# Patient Record
Sex: Male | Born: 1960
Health system: Southern US, Community
[De-identification: ages and names within clinical notes are randomized; demographics above are authoritative.]

## PROBLEM LIST (undated history)

## (undated) DIAGNOSIS — E119 Type 2 diabetes mellitus without complications: Secondary | ICD-10-CM

## (undated) DIAGNOSIS — F419 Anxiety disorder, unspecified: Secondary | ICD-10-CM

## (undated) DIAGNOSIS — M1990 Unspecified osteoarthritis, unspecified site: Secondary | ICD-10-CM

## (undated) DIAGNOSIS — R3914 Feeling of incomplete bladder emptying: Secondary | ICD-10-CM

## (undated) DIAGNOSIS — E7800 Pure hypercholesterolemia, unspecified: Secondary | ICD-10-CM

## (undated) DIAGNOSIS — K219 Gastro-esophageal reflux disease without esophagitis: Secondary | ICD-10-CM

## (undated) HISTORY — PX: OTHER SURGICAL HISTORY: SHX169

---

## 1998-04-07 ENCOUNTER — Encounter: Admission: RE | Admit: 1998-04-07 | Discharge: 1998-07-06 | Payer: Self-pay | Admitting: Anesthesiology

## 1998-06-22 ENCOUNTER — Ambulatory Visit (HOSPITAL_COMMUNITY): Admission: RE | Admit: 1998-06-22 | Discharge: 1998-06-22 | Payer: Self-pay | Admitting: Neurological Surgery

## 1998-07-10 ENCOUNTER — Ambulatory Visit (HOSPITAL_BASED_OUTPATIENT_CLINIC_OR_DEPARTMENT_OTHER): Admission: RE | Admit: 1998-07-10 | Discharge: 1998-07-10 | Payer: Self-pay | Admitting: *Deleted

## 1998-08-10 ENCOUNTER — Encounter: Payer: Self-pay | Admitting: Neurological Surgery

## 1998-08-14 ENCOUNTER — Encounter: Payer: Self-pay | Admitting: Neurological Surgery

## 1998-08-14 ENCOUNTER — Inpatient Hospital Stay (HOSPITAL_COMMUNITY): Admission: RE | Admit: 1998-08-14 | Discharge: 1998-08-19 | Payer: Self-pay | Admitting: Neurological Surgery

## 1999-08-08 ENCOUNTER — Encounter: Payer: Self-pay | Admitting: Neurological Surgery

## 1999-08-08 ENCOUNTER — Encounter: Admission: RE | Admit: 1999-08-08 | Discharge: 1999-08-08 | Payer: Self-pay | Admitting: Neurological Surgery

## 2000-01-09 ENCOUNTER — Encounter: Payer: Self-pay | Admitting: Neurological Surgery

## 2000-01-09 ENCOUNTER — Ambulatory Visit (HOSPITAL_COMMUNITY): Admission: RE | Admit: 2000-01-09 | Discharge: 2000-01-09 | Payer: Self-pay | Admitting: Neurological Surgery

## 2000-02-29 ENCOUNTER — Ambulatory Visit (HOSPITAL_BASED_OUTPATIENT_CLINIC_OR_DEPARTMENT_OTHER): Admission: RE | Admit: 2000-02-29 | Discharge: 2000-02-29 | Payer: Self-pay | Admitting: Orthopedic Surgery

## 2000-10-21 HISTORY — PX: LUMBAR FUSION: SHX111

## 2001-02-05 ENCOUNTER — Encounter: Payer: Self-pay | Admitting: Neurological Surgery

## 2001-02-09 ENCOUNTER — Inpatient Hospital Stay (HOSPITAL_COMMUNITY): Admission: RE | Admit: 2001-02-09 | Discharge: 2001-02-12 | Payer: Self-pay | Admitting: Neurological Surgery

## 2001-02-09 ENCOUNTER — Encounter: Payer: Self-pay | Admitting: Neurological Surgery

## 2001-04-08 ENCOUNTER — Encounter: Admission: RE | Admit: 2001-04-08 | Discharge: 2001-04-08 | Payer: Self-pay | Admitting: Neurological Surgery

## 2001-04-08 ENCOUNTER — Encounter: Payer: Self-pay | Admitting: Neurological Surgery

## 2001-07-22 ENCOUNTER — Encounter: Payer: Self-pay | Admitting: Neurological Surgery

## 2001-07-22 ENCOUNTER — Encounter: Admission: RE | Admit: 2001-07-22 | Discharge: 2001-07-22 | Payer: Self-pay | Admitting: Neurological Surgery

## 2006-02-19 ENCOUNTER — Encounter: Payer: Self-pay | Admitting: Neurological Surgery

## 2014-12-12 DIAGNOSIS — Z6841 Body Mass Index (BMI) 40.0 and over, adult: Secondary | ICD-10-CM | POA: Diagnosis not present

## 2014-12-12 DIAGNOSIS — Z0000 Encounter for general adult medical examination without abnormal findings: Secondary | ICD-10-CM | POA: Diagnosis not present

## 2014-12-12 DIAGNOSIS — I10 Essential (primary) hypertension: Secondary | ICD-10-CM | POA: Diagnosis not present

## 2014-12-12 DIAGNOSIS — E119 Type 2 diabetes mellitus without complications: Secondary | ICD-10-CM | POA: Diagnosis not present

## 2015-02-20 DIAGNOSIS — E119 Type 2 diabetes mellitus without complications: Secondary | ICD-10-CM | POA: Diagnosis not present

## 2015-02-20 DIAGNOSIS — Z6841 Body Mass Index (BMI) 40.0 and over, adult: Secondary | ICD-10-CM | POA: Diagnosis not present

## 2015-07-27 DIAGNOSIS — Z713 Dietary counseling and surveillance: Secondary | ICD-10-CM | POA: Diagnosis not present

## 2015-07-27 DIAGNOSIS — Z6841 Body Mass Index (BMI) 40.0 and over, adult: Secondary | ICD-10-CM | POA: Diagnosis not present

## 2015-07-27 DIAGNOSIS — E782 Mixed hyperlipidemia: Secondary | ICD-10-CM | POA: Diagnosis not present

## 2015-07-27 DIAGNOSIS — E119 Type 2 diabetes mellitus without complications: Secondary | ICD-10-CM | POA: Diagnosis not present

## 2015-08-14 DIAGNOSIS — E782 Mixed hyperlipidemia: Secondary | ICD-10-CM | POA: Diagnosis not present

## 2015-08-14 DIAGNOSIS — E119 Type 2 diabetes mellitus without complications: Secondary | ICD-10-CM | POA: Diagnosis not present

## 2015-08-16 DIAGNOSIS — Z6841 Body Mass Index (BMI) 40.0 and over, adult: Secondary | ICD-10-CM | POA: Diagnosis not present

## 2015-08-16 DIAGNOSIS — E119 Type 2 diabetes mellitus without complications: Secondary | ICD-10-CM | POA: Diagnosis not present

## 2015-08-16 DIAGNOSIS — E785 Hyperlipidemia, unspecified: Secondary | ICD-10-CM | POA: Diagnosis not present

## 2015-09-21 DIAGNOSIS — J00 Acute nasopharyngitis [common cold]: Secondary | ICD-10-CM | POA: Diagnosis not present

## 2015-09-26 DIAGNOSIS — R05 Cough: Secondary | ICD-10-CM | POA: Diagnosis not present

## 2015-09-26 DIAGNOSIS — H6691 Otitis media, unspecified, right ear: Secondary | ICD-10-CM | POA: Diagnosis not present

## 2015-09-26 DIAGNOSIS — Z6841 Body Mass Index (BMI) 40.0 and over, adult: Secondary | ICD-10-CM | POA: Diagnosis not present

## 2015-12-05 DIAGNOSIS — Z6841 Body Mass Index (BMI) 40.0 and over, adult: Secondary | ICD-10-CM | POA: Diagnosis not present

## 2015-12-05 DIAGNOSIS — Z125 Encounter for screening for malignant neoplasm of prostate: Secondary | ICD-10-CM | POA: Diagnosis not present

## 2015-12-05 DIAGNOSIS — Z0000 Encounter for general adult medical examination without abnormal findings: Secondary | ICD-10-CM | POA: Diagnosis not present

## 2015-12-05 DIAGNOSIS — Z1211 Encounter for screening for malignant neoplasm of colon: Secondary | ICD-10-CM | POA: Diagnosis not present

## 2016-02-15 DIAGNOSIS — M792 Neuralgia and neuritis, unspecified: Secondary | ICD-10-CM | POA: Diagnosis not present

## 2016-02-15 DIAGNOSIS — Z6841 Body Mass Index (BMI) 40.0 and over, adult: Secondary | ICD-10-CM | POA: Diagnosis not present

## 2016-02-15 DIAGNOSIS — M542 Cervicalgia: Secondary | ICD-10-CM | POA: Diagnosis not present

## 2016-02-20 DIAGNOSIS — M5412 Radiculopathy, cervical region: Secondary | ICD-10-CM | POA: Diagnosis not present

## 2016-02-29 DIAGNOSIS — M50222 Other cervical disc displacement at C5-C6 level: Secondary | ICD-10-CM | POA: Diagnosis not present

## 2016-02-29 DIAGNOSIS — M4802 Spinal stenosis, cervical region: Secondary | ICD-10-CM | POA: Diagnosis not present

## 2016-02-29 DIAGNOSIS — Q7649 Other congenital malformations of spine, not associated with scoliosis: Secondary | ICD-10-CM | POA: Diagnosis not present

## 2016-03-04 DIAGNOSIS — L02429 Furuncle of limb, unspecified: Secondary | ICD-10-CM | POA: Diagnosis not present

## 2016-03-04 DIAGNOSIS — E119 Type 2 diabetes mellitus without complications: Secondary | ICD-10-CM | POA: Diagnosis not present

## 2016-03-04 DIAGNOSIS — K219 Gastro-esophageal reflux disease without esophagitis: Secondary | ICD-10-CM | POA: Diagnosis not present

## 2016-03-04 DIAGNOSIS — E669 Obesity, unspecified: Secondary | ICD-10-CM | POA: Diagnosis not present

## 2016-03-04 DIAGNOSIS — I10 Essential (primary) hypertension: Secondary | ICD-10-CM | POA: Diagnosis not present

## 2016-03-04 DIAGNOSIS — Z6841 Body Mass Index (BMI) 40.0 and over, adult: Secondary | ICD-10-CM | POA: Diagnosis not present

## 2016-03-05 DIAGNOSIS — M5412 Radiculopathy, cervical region: Secondary | ICD-10-CM | POA: Diagnosis not present

## 2016-04-04 DIAGNOSIS — M4712 Other spondylosis with myelopathy, cervical region: Secondary | ICD-10-CM | POA: Diagnosis not present

## 2016-04-04 DIAGNOSIS — R030 Elevated blood-pressure reading, without diagnosis of hypertension: Secondary | ICD-10-CM | POA: Diagnosis not present

## 2016-04-04 DIAGNOSIS — Z6839 Body mass index (BMI) 39.0-39.9, adult: Secondary | ICD-10-CM | POA: Diagnosis not present

## 2016-04-19 ENCOUNTER — Other Ambulatory Visit: Payer: Self-pay | Admitting: Neurological Surgery

## 2016-04-25 ENCOUNTER — Encounter (HOSPITAL_COMMUNITY)
Admission: RE | Admit: 2016-04-25 | Discharge: 2016-04-25 | Disposition: A | Discharge status: Home / Self Care | Payer: Commercial Managed Care - HMO | Source: Ambulatory Visit | Attending: Neurological Surgery | Admitting: Neurological Surgery

## 2016-04-25 ENCOUNTER — Encounter (HOSPITAL_COMMUNITY): Payer: Self-pay

## 2016-04-25 DIAGNOSIS — I440 Atrioventricular block, first degree: Secondary | ICD-10-CM | POA: Diagnosis not present

## 2016-04-25 DIAGNOSIS — M4802 Spinal stenosis, cervical region: Secondary | ICD-10-CM | POA: Diagnosis not present

## 2016-04-25 DIAGNOSIS — Z01818 Encounter for other preprocedural examination: Secondary | ICD-10-CM | POA: Insufficient documentation

## 2016-04-25 DIAGNOSIS — Z7984 Long term (current) use of oral hypoglycemic drugs: Secondary | ICD-10-CM | POA: Insufficient documentation

## 2016-04-25 DIAGNOSIS — Z882 Allergy status to sulfonamides status: Secondary | ICD-10-CM | POA: Insufficient documentation

## 2016-04-25 DIAGNOSIS — Z885 Allergy status to narcotic agent status: Secondary | ICD-10-CM | POA: Insufficient documentation

## 2016-04-25 DIAGNOSIS — Z01812 Encounter for preprocedural laboratory examination: Secondary | ICD-10-CM | POA: Insufficient documentation

## 2016-04-25 DIAGNOSIS — E119 Type 2 diabetes mellitus without complications: Secondary | ICD-10-CM | POA: Diagnosis not present

## 2016-04-25 HISTORY — DX: Feeling of incomplete bladder emptying: R39.14

## 2016-04-25 HISTORY — DX: Pure hypercholesterolemia, unspecified: E78.00

## 2016-04-25 HISTORY — DX: Anxiety disorder, unspecified: F41.9

## 2016-04-25 HISTORY — DX: Type 2 diabetes mellitus without complications: E11.9

## 2016-04-25 HISTORY — DX: Gastro-esophageal reflux disease without esophagitis: K21.9

## 2016-04-25 HISTORY — DX: Unspecified osteoarthritis, unspecified site: M19.90

## 2016-04-25 LAB — BASIC METABOLIC PANEL
ANION GAP: 6 (ref 5–15)
BUN: 14 mg/dL (ref 6–20)
CO2: 25 mmol/L (ref 22–32)
Calcium: 9.4 mg/dL (ref 8.9–10.3)
Chloride: 108 mmol/L (ref 101–111)
Creatinine, Ser: 0.88 mg/dL (ref 0.61–1.24)
GFR calc Af Amer: >60 mL/min (ref >60)
GFR calc non Af Amer: >60 mL/min (ref >60)
GLUCOSE: 145 mg/dL — AB (ref 65–99)
POTASSIUM: 4.3 mmol/L (ref 3.5–5.1)
Sodium: 139 mmol/L (ref 135–145)

## 2016-04-25 LAB — SURGICAL PCR SCREEN
MRSA, PCR: NEGATIVE
Staphylococcus aureus: NEGATIVE

## 2016-04-25 LAB — CBC
HEMATOCRIT: 44.9 % (ref 39.0–52.0)
HEMOGLOBIN: 15.1 g/dL (ref 13.0–17.0)
MCH: 28.3 pg (ref 26.0–34.0)
MCHC: 33.6 g/dL (ref 30.0–36.0)
MCV: 84.2 fL (ref 78.0–100.0)
Platelets: 185 10*3/uL (ref 150–400)
RBC: 5.33 MIL/uL (ref 4.22–5.81)
RDW: 13.6 % (ref 11.5–15.5)
WBC: 6 10*3/uL (ref 4.0–10.5)

## 2016-04-25 LAB — GLUCOSE, CAPILLARY: Glucose-Capillary: 149 mg/dL — ABNORMAL HIGH (ref 65–99)

## 2016-04-25 NOTE — Progress Notes (Signed)
PC[facility] No [facility]ologist No echo, cath, last stress test >7376yr ago.   No complaints of SOB, cough, fever, chest pain.   Pt Diabetic, Hgb A1c pending, per pt last A1c 7.6 a few months ago and fasting CBG 99-110. Pt educated to monitor CBG closely and notify PCP if CBG >200 prior to surgery.

## 2016-04-25 NOTE — Pre-Procedure Instructions (Signed)
John GrebeJames S Lloyd  04/25/2016      [facility] [address]- [place], [place] - [id number] [place] [id number] Phone: [phone] Fax: [phone]    Your procedure is scheduled on Monday, July 17th, 2017.  [facility] Ad[facility] at 12:00 P.M.   Call this number if you have problems the morning of surgery:  [phone]   Remember:  Do not eat food or drink liquids after midnight.   Take these medicines the morning of surgery with A SIP OF WATER: None.  7 days prior to surgery, stop taking: Aspirin, NSAIDS, Aleve, Naproxen, Ibuprofen, Advil, Motrin, BC's, Goody's, Fish oil, all herbal medications and all vitamins (5-HTP, B complex, glucosamine-chondroitin, milk thistle, omega-3, bitter melon)  WHAT DO I DO ABOUT MY DIABETES MEDICATION?  Marland Kitchen. Do not take oral diabetes medicines (pills) the morning of surgery.  Do NOT take Metformin the morning of surgery.   How to Manage Your Diabetes Before and After Surgery  Why is it important to control my blood sugar before and after surgery? . Improving blood sugar levels before and after surgery helps healing and can limit problems. . A way of improving blood sugar control is eating a healthy diet by: o  Eating less sugar and carbohydrates o  Increasing activity/exercise o  Talking with your doctor about reaching your blood sugar goals . High blood sugars (greater than 180 mg/dL) can raise your risk of infections and slow your recovery, so you will need to focus on controlling your diabetes during the weeks before surgery. . Make sure that the doctor who takes care of your diabetes knows about your planned surgery including the date and location.  How do I manage my blood sugar before surgery? . Check your blood sugar at least 4 times a day, starting 2 days before surgery, to make sure that the level is not too high or low. o Check your blood sugar the morning of your surgery when you wake up  and every 2 hours until you get to the Short Stay unit. . If your blood sugar is less than 70 mg/dL, you will need to treat for low blood sugar: o Do not take insulin. o Treat a low blood sugar (less than 70 mg/dL) with  cup of clear juice (cranberry or apple), 4 glucose tablets, OR glucose gel. o Recheck blood sugar in 15 minutes after treatment (to make sure it is greater than 70 mg/dL). If your blood sugar is not greater than 70 mg/dL on recheck, call [phone] for further instructions. . Report your blood sugar to the short stay nurse when you get to Short Stay.  . If you are admitted to the hospital after surgery: o Your blood sugar will be checked by the staff and you will probably be given insulin after surgery (instead of oral diabetes medicines) to make sure you have good blood sugar levels. o The goal for blood sugar control after surgery is 80-180 mg/dL.    Do not wear jewelry.  Do not wear lotions, powders, or colognes.   Men may shave face and neck.  Do not bring valuables to the hospital.   [facility] any belongings or valuables.  Contacts, dentures or bridgework may not be worn into surgery.  Leave your suitcase in the car.  After surgery it may be brought to your room.  For patients admitted to the hospital, discharge time will be determined  by your treatment team.  Patients discharged the day of surgery will not be allowed to drive home.   Special instructions:  Preparing for Surgery  Please read over the following fact sheets that you were given. MRSA Information     [place]- Preparing For Surgery  Before surgery, you can play an important role. Because skin is not sterile, your skin needs to be as free of germs as possible. You can reduce the number of germs on your skin by washing with CHG (chlorahexidine gluconate) Soap before surgery.  CHG is an antiseptic cleaner which kills germs and bonds with the skin to continue killing  germs even after washing.  Please do not use if you have an allergy to CHG or antibacterial soaps. If your skin becomes reddened/irritated stop using the CHG.  Do not shave (including legs and underarms) for at least 48 hours prior to first CHG shower. It is OK to shave your face.  Please follow these instructions carefully.   1. Shower the NIGHT BEFORE SURGERY and the MORNING OF SURGERY with CHG.   2. If you chose to wash your hair, wash your hair first as usual with your normal shampoo.  3. After you shampoo, rinse your hair and body thoroughly to remove the shampoo.  4. Use CHG as you would any other liquid soap. You can apply CHG directly to the skin and wash gently with a scrungie or a clean washcloth.   5. Apply the CHG Soap to your body ONLY FROM THE NECK DOWN.  Do not use on open wounds or open sores. Avoid contact with your eyes, ears, mouth and genitals (private parts). Wash genitals (private parts) with your normal soap.  6. Wash thoroughly, paying special attention to the area where your surgery will be performed.  7. Thoroughly rinse your body with warm water from the neck down.  8. DO NOT shower/wash with your normal soap after using and rinsing off the CHG Soap.  9. Pat yourself dry with a CLEAN TOWEL.   10. Wear CLEAN PAJAMAS   11. Place CLEAN SHEETS on your bed the night of your first shower and DO NOT SLEEP WITH PETS.   Day of Surgery: Do not apply any deodorants/lotions. Please wear clean clothes to the hospital/surgery center.

## 2016-04-26 LAB — HEMOGLOBIN A1C
HEMOGLOBIN A1C: 7.2 % — AB (ref 4.8–5.6)
Mean Plasma Glucose: 160 mg/dL

## 2016-05-03 MED ORDER — DEXTROSE 5 % IV SOLN
3.0000 g | INTRAVENOUS | Status: AC
  Administered 2016-05-06: 3 g via INTRAVENOUS
  Filled 2016-05-03: qty 3000

## 2016-05-06 ENCOUNTER — Ambulatory Visit (HOSPITAL_COMMUNITY): Payer: Commercial Managed Care - HMO | Admitting: Certified Registered"

## 2016-05-06 ENCOUNTER — Ambulatory Visit (HOSPITAL_COMMUNITY): Payer: Commercial Managed Care - HMO

## 2016-05-06 ENCOUNTER — Encounter (HOSPITAL_COMMUNITY)
Admission: RE | Disposition: A | Discharge status: Home / Self Care | Payer: Self-pay | Source: Ambulatory Visit | Attending: Neurological Surgery

## 2016-05-06 ENCOUNTER — Observation Stay (HOSPITAL_COMMUNITY)
Admission: RE | Admit: 2016-05-06 | Discharge: 2016-05-07 | Disposition: A | Discharge status: Home / Self Care | Payer: Commercial Managed Care - HMO | Source: Ambulatory Visit | Attending: Neurological Surgery | Admitting: Neurological Surgery

## 2016-05-06 DIAGNOSIS — E119 Type 2 diabetes mellitus without complications: Secondary | ICD-10-CM | POA: Insufficient documentation

## 2016-05-06 DIAGNOSIS — F1920 Other psychoactive substance dependence, uncomplicated: Secondary | ICD-10-CM | POA: Diagnosis not present

## 2016-05-06 DIAGNOSIS — Z7984 Long term (current) use of oral hypoglycemic drugs: Secondary | ICD-10-CM | POA: Insufficient documentation

## 2016-05-06 DIAGNOSIS — Z981 Arthrodesis status: Secondary | ICD-10-CM | POA: Insufficient documentation

## 2016-05-06 DIAGNOSIS — Z6841 Body Mass Index (BMI) 40.0 and over, adult: Secondary | ICD-10-CM | POA: Diagnosis not present

## 2016-05-06 DIAGNOSIS — M1990 Unspecified osteoarthritis, unspecified site: Secondary | ICD-10-CM | POA: Diagnosis not present

## 2016-05-06 DIAGNOSIS — F17200 Nicotine dependence, unspecified, uncomplicated: Secondary | ICD-10-CM | POA: Insufficient documentation

## 2016-05-06 DIAGNOSIS — M50022 Cervical disc disorder at C5-C6 level with myelopathy: Principal | ICD-10-CM | POA: Insufficient documentation

## 2016-05-06 DIAGNOSIS — Z419 Encounter for procedure for purposes other than remedying health state, unspecified: Secondary | ICD-10-CM

## 2016-05-06 DIAGNOSIS — M4722 Other spondylosis with radiculopathy, cervical region: Secondary | ICD-10-CM | POA: Diagnosis not present

## 2016-05-06 DIAGNOSIS — M4712 Other spondylosis with myelopathy, cervical region: Secondary | ICD-10-CM | POA: Insufficient documentation

## 2016-05-06 DIAGNOSIS — M50122 Cervical disc disorder at C5-C6 level with radiculopathy: Secondary | ICD-10-CM | POA: Diagnosis not present

## 2016-05-06 DIAGNOSIS — F419 Anxiety disorder, unspecified: Secondary | ICD-10-CM | POA: Diagnosis not present

## 2016-05-06 DIAGNOSIS — K219 Gastro-esophageal reflux disease without esophagitis: Secondary | ICD-10-CM | POA: Diagnosis not present

## 2016-05-06 DIAGNOSIS — M4802 Spinal stenosis, cervical region: Secondary | ICD-10-CM | POA: Diagnosis not present

## 2016-05-06 DIAGNOSIS — M50222 Other cervical disc displacement at C5-C6 level: Secondary | ICD-10-CM | POA: Diagnosis present

## 2016-05-06 HISTORY — PX: CERVICAL DISC ARTHROPLASTY: SHX587

## 2016-05-06 LAB — GLUCOSE, CAPILLARY
GLUCOSE-CAPILLARY: 179 mg/dL — AB (ref 65–99)
Glucose-Capillary: 149 mg/dL — ABNORMAL HIGH (ref 65–99)
Glucose-Capillary: 141 mg/dL — ABNORMAL HIGH (ref 65–99)

## 2016-05-06 LAB — TYPE AND SCREEN
ABO/RH(D): B POS
Antibody Screen: NEGATIVE

## 2016-05-06 LAB — ABO/RH: ABO/RH(D): B POS

## 2016-05-06 SURGERY — CERVICAL ANTERIOR DISC ARTHROPLASTY
Anesthesia: General

## 2016-05-06 MED ORDER — FENTANYL CITRATE (PF) 250 MCG/5ML IJ SOLN
INTRAMUSCULAR | Status: AC
  Filled 2016-05-06: qty 5

## 2016-05-06 MED ORDER — SUGAMMADEX SODIUM 500 MG/5ML IV SOLN
INTRAVENOUS | Status: DC | PRN
  Administered 2016-05-06: 300 mg via INTRAVENOUS

## 2016-05-06 MED ORDER — ONDANSETRON HCL 4 MG/2ML IJ SOLN
INTRAMUSCULAR | Status: AC
  Filled 2016-05-06: qty 2

## 2016-05-06 MED ORDER — MIDAZOLAM HCL 2 MG/2ML IJ SOLN
INTRAMUSCULAR | Status: AC
  Filled 2016-05-06: qty 2

## 2016-05-06 MED ORDER — CHLORHEXIDINE GLUCONATE CLOTH 2 % EX PADS: 6.0000 | MEDICATED_PAD | Freq: Once | CUTANEOUS | Status: DC

## 2016-05-06 MED ORDER — PROPOFOL 10 MG/ML IV BOLUS
INTRAVENOUS | Status: AC
  Filled 2016-05-06: qty 20

## 2016-05-06 MED ORDER — SENNA 8.6 MG PO TABS
1.0000 | ORAL_TABLET | Freq: Two times a day (BID) | ORAL | Status: DC
  Administered 2016-05-06 – 2016-05-07 (×2): 8.6 mg via ORAL
  Filled 2016-05-06 (×2): qty 1

## 2016-05-06 MED ORDER — MIDAZOLAM HCL 5 MG/5ML IJ SOLN
INTRAMUSCULAR | Status: DC | PRN
  Administered 2016-05-06: 2 mg via INTRAVENOUS

## 2016-05-06 MED ORDER — OXYCODONE HCL 5 MG PO TABS: 5.0000 mg | ORAL_TABLET | Freq: Once | ORAL | Status: DC | PRN

## 2016-05-06 MED ORDER — ACETAMINOPHEN 325 MG PO TABS: 650.0000 mg | ORAL_TABLET | ORAL | Status: DC | PRN

## 2016-05-06 MED ORDER — MILK THISTLE 175 MG PO TABS: 600.0000 mg | ORAL_TABLET | Freq: Every day | ORAL | Status: DC

## 2016-05-06 MED ORDER — SODIUM CHLORIDE 0.9 % IV SOLN: 250.0000 mL | INTRAVENOUS | Status: DC

## 2016-05-06 MED ORDER — ALUM & MAG HYDROXIDE-SIMETH 200-200-20 MG/5ML PO SUSP: 30.0000 mL | Freq: Four times a day (QID) | ORAL | Status: DC | PRN

## 2016-05-06 MED ORDER — PROPOFOL 10 MG/ML IV BOLUS
INTRAVENOUS | Status: DC | PRN
  Administered 2016-05-06: 200 mg via INTRAVENOUS

## 2016-05-06 MED ORDER — METHOCARBAMOL 500 MG PO TABS
500.0000 mg | ORAL_TABLET | Freq: Four times a day (QID) | ORAL | Status: DC | PRN
  Administered 2016-05-06: 500 mg via ORAL
  Filled 2016-05-06: qty 1

## 2016-05-06 MED ORDER — ONDANSETRON HCL 4 MG/2ML IJ SOLN
INTRAMUSCULAR | Status: DC | PRN
  Administered 2016-05-06: 4 mg via INTRAVENOUS

## 2016-05-06 MED ORDER — OXYCODONE-ACETAMINOPHEN 5-325 MG PO TABS
1.0000 | ORAL_TABLET | ORAL | Status: DC | PRN
  Administered 2016-05-06 – 2016-05-07 (×3): 2 via ORAL
  Filled 2016-05-06 (×3): qty 2

## 2016-05-06 MED ORDER — SUGAMMADEX SODIUM 200 MG/2ML IV SOLN
INTRAVENOUS | Status: AC
  Filled 2016-05-06: qty 2

## 2016-05-06 MED ORDER — FENTANYL CITRATE (PF) 100 MCG/2ML IJ SOLN
INTRAMUSCULAR | Status: DC | PRN
  Administered 2016-05-06 (×3): 50 ug via INTRAVENOUS
  Administered 2016-05-06: 100 ug via INTRAVENOUS
  Administered 2016-05-06: 50 ug via INTRAVENOUS

## 2016-05-06 MED ORDER — SUCCINYLCHOLINE CHLORIDE 20 MG/ML IJ SOLN
INTRAMUSCULAR | Status: DC | PRN
  Administered 2016-05-06: 120 mg via INTRAVENOUS

## 2016-05-06 MED ORDER — BUPIVACAINE HCL (PF) 0.5 % IJ SOLN
INTRAMUSCULAR | Status: DC | PRN
  Administered 2016-05-06: 6 mL

## 2016-05-06 MED ORDER — ACETAMINOPHEN 650 MG RE SUPP: 650.0000 mg | RECTAL | Status: DC | PRN

## 2016-05-06 MED ORDER — GLUCOSAMINE-CHONDROITIN 500-400 MG PO TABS: 1.0000 | ORAL_TABLET | Freq: Two times a day (BID) | ORAL | Status: DC

## 2016-05-06 MED ORDER — HYDROMORPHONE HCL 1 MG/ML IJ SOLN
INTRAMUSCULAR | Status: AC
  Filled 2016-05-06: qty 1

## 2016-05-06 MED ORDER — 5-HTP 100 MG PO CAPS: ORAL_CAPSULE | Freq: Every day | ORAL | Status: DC

## 2016-05-06 MED ORDER — DOCUSATE SODIUM 100 MG PO CAPS
100.0000 mg | ORAL_CAPSULE | Freq: Two times a day (BID) | ORAL | Status: DC
  Administered 2016-05-06 – 2016-05-07 (×2): 100 mg via ORAL
  Filled 2016-05-06 (×2): qty 1

## 2016-05-06 MED ORDER — GELATIN ABSORBABLE MT POWD
OROMUCOSAL | Status: DC | PRN
  Administered 2016-05-06: 16:00:00 via TOPICAL

## 2016-05-06 MED ORDER — PHENYLEPHRINE 40 MCG/ML (10ML) SYRINGE FOR IV PUSH (FOR BLOOD PRESSURE SUPPORT)
PREFILLED_SYRINGE | INTRAVENOUS | Status: AC
Start: 2016-05-06 — End: 2016-05-06
  Filled 2016-05-06: qty 10

## 2016-05-06 MED ORDER — HEMOSTATIC AGENTS (NO CHARGE) OPTIME
TOPICAL | Status: DC | PRN
  Administered 2016-05-06: 1 via TOPICAL

## 2016-05-06 MED ORDER — LACTATED RINGERS IV SOLN
INTRAVENOUS | Status: DC
  Administered 2016-05-06 (×2): via INTRAVENOUS

## 2016-05-06 MED ORDER — HYDROMORPHONE HCL 1 MG/ML IJ SOLN
0.2500 mg | INTRAMUSCULAR | Status: DC | PRN
  Administered 2016-05-06 (×4): 0.5 mg via INTRAVENOUS

## 2016-05-06 MED ORDER — OXYCODONE HCL 5 MG/5ML PO SOLN: 5.0000 mg | Freq: Once | ORAL | Status: DC | PRN

## 2016-05-06 MED ORDER — SODIUM CHLORIDE 0.9% FLUSH: 3.0000 mL | INTRAVENOUS | Status: DC | PRN

## 2016-05-06 MED ORDER — ROCURONIUM BROMIDE 100 MG/10ML IV SOLN
INTRAVENOUS | Status: DC | PRN
  Administered 2016-05-06 (×2): 50 mg via INTRAVENOUS

## 2016-05-06 MED ORDER — ROCURONIUM BROMIDE 50 MG/5ML IV SOLN
INTRAVENOUS | Status: AC
  Filled 2016-05-06: qty 1

## 2016-05-06 MED ORDER — KETOROLAC TROMETHAMINE 15 MG/ML IJ SOLN
15.0000 mg | Freq: Four times a day (QID) | INTRAMUSCULAR | Status: DC
  Administered 2016-05-06 – 2016-05-07 (×3): 15 mg via INTRAVENOUS
  Filled 2016-05-06 (×3): qty 1

## 2016-05-06 MED ORDER — HYDROCODONE-ACETAMINOPHEN 5-325 MG PO TABS: 1.0000 | ORAL_TABLET | ORAL | Status: DC | PRN

## 2016-05-06 MED ORDER — EPHEDRINE SULFATE 50 MG/ML IJ SOLN
INTRAMUSCULAR | Status: DC | PRN
  Administered 2016-05-06: 20 mg via INTRAVENOUS

## 2016-05-06 MED ORDER — SODIUM CHLORIDE 0.9% FLUSH
3.0000 mL | Freq: Two times a day (BID) | INTRAVENOUS | Status: DC
  Administered 2016-05-07: 3 mL via INTRAVENOUS

## 2016-05-06 MED ORDER — LIDOCAINE 2% (20 MG/ML) 5 ML SYRINGE
INTRAMUSCULAR | Status: AC
  Filled 2016-05-06: qty 5

## 2016-05-06 MED ORDER — MORPHINE SULFATE (PF) 2 MG/ML IV SOLN: 1.0000 mg | INTRAVENOUS | Status: DC | PRN

## 2016-05-06 MED ORDER — PHENYLEPHRINE HCL 10 MG/ML IJ SOLN
10.0000 mg | INTRAMUSCULAR | Status: DC | PRN
  Administered 2016-05-06: 30 ug/min via INTRAVENOUS

## 2016-05-06 MED ORDER — CEFAZOLIN IN D5W 1 GM/50ML IV SOLN
1.0000 g | Freq: Three times a day (TID) | INTRAVENOUS | Status: AC
  Administered 2016-05-06: 1 g via INTRAVENOUS
  Filled 2016-05-06: qty 50

## 2016-05-06 MED ORDER — LIDOCAINE-EPINEPHRINE 1 %-1:100000 IJ SOLN
INTRAMUSCULAR | Status: DC | PRN
  Administered 2016-05-06: 6 mL

## 2016-05-06 MED ORDER — MENTHOL 3 MG MT LOZG: 1.0000 | LOZENGE | OROMUCOSAL | Status: DC | PRN

## 2016-05-06 MED ORDER — THROMBIN 5000 UNITS EX SOLR
CUTANEOUS | Status: DC | PRN
  Administered 2016-05-06 (×2): 5000 [IU] via TOPICAL

## 2016-05-06 MED ORDER — ONDANSETRON HCL 4 MG/2ML IJ SOLN: 4.0000 mg | INTRAMUSCULAR | Status: DC | PRN

## 2016-05-06 MED ORDER — POLYETHYLENE GLYCOL 3350 17 G PO PACK: 17.0000 g | PACK | Freq: Every day | ORAL | Status: DC | PRN

## 2016-05-06 MED ORDER — LIDOCAINE HCL (CARDIAC) 20 MG/ML IV SOLN
INTRAVENOUS | Status: DC | PRN
  Administered 2016-05-06: 100 mg via INTRAVENOUS

## 2016-05-06 MED ORDER — SALINE SPRAY 0.65 % NA SOLN: 1.0000 | NASAL | Status: DC | PRN

## 2016-05-06 MED ORDER — DIAZEPAM 5 MG/ML IJ SOLN
5.0000 mg | Freq: Once | INTRAMUSCULAR | Status: AC
  Administered 2016-05-06: 5 mg via INTRAVENOUS

## 2016-05-06 MED ORDER — METHOCARBAMOL 1000 MG/10ML IJ SOLN
500.0000 mg | Freq: Four times a day (QID) | INTRAVENOUS | Status: DC | PRN
  Filled 2016-05-06: qty 5

## 2016-05-06 MED ORDER — SUGAMMADEX SODIUM 500 MG/5ML IV SOLN
INTRAVENOUS | Status: AC
  Filled 2016-05-06: qty 5

## 2016-05-06 MED ORDER — METFORMIN HCL 500 MG PO TABS
1000.0000 mg | ORAL_TABLET | Freq: Two times a day (BID) | ORAL | Status: DC
  Administered 2016-05-07: 1000 mg via ORAL
  Filled 2016-05-06: qty 2

## 2016-05-06 MED ORDER — ALBUTEROL SULFATE HFA 108 (90 BASE) MCG/ACT IN AERS
INHALATION_SPRAY | RESPIRATORY_TRACT | Status: DC | PRN
  Administered 2016-05-06 (×2): 2 via RESPIRATORY_TRACT

## 2016-05-06 MED ORDER — PHENOL 1.4 % MT LIQD: 1.0000 | OROMUCOSAL | Status: DC | PRN

## 2016-05-06 MED ORDER — SODIUM CHLORIDE 0.9 % IR SOLN
Status: DC | PRN
  Administered 2016-05-06: 16:00:00

## 2016-05-06 MED ORDER — PHENYLEPHRINE HCL 10 MG/ML IJ SOLN
INTRAMUSCULAR | Status: DC | PRN
  Administered 2016-05-06: 120 ug via INTRAVENOUS
  Administered 2016-05-06: 160 ug via INTRAVENOUS

## 2016-05-06 MED ORDER — 0.9 % SODIUM CHLORIDE (POUR BTL) OPTIME
TOPICAL | Status: DC | PRN
  Administered 2016-05-06: 1000 mL

## 2016-05-06 MED ORDER — DIAZEPAM 5 MG/ML IJ SOLN
INTRAMUSCULAR | Status: AC
  Filled 2016-05-06: qty 2

## 2016-05-06 MED ORDER — PROMETHAZINE HCL 25 MG/ML IJ SOLN: 6.2500 mg | INTRAMUSCULAR | Status: DC | PRN

## 2016-05-06 SURGICAL SUPPLY — 45 items
ADH SKN CLS APL DERMABOND .7 (GAUZE/BANDAGES/DRESSINGS) ×1
BAG DECANTER FOR FLEXI CONT (MISCELLANEOUS) ×2 IMPLANT
BIT DRILL NEURO 2X3.1 SFT TUCH (MISCELLANEOUS) ×1 IMPLANT
BNDG GAUZE ELAST 4 BULKY (GAUZE/BANDAGES/DRESSINGS) IMPLANT
CANISTER SUCT 3000ML PPV (MISCELLANEOUS) ×2 IMPLANT
DECANTER SPIKE VIAL GLASS SM (MISCELLANEOUS) ×2 IMPLANT
DERMABOND ADVANCED (GAUZE/BANDAGES/DRESSINGS) ×1
DERMABOND ADVANCED .7 DNX12 (GAUZE/BANDAGES/DRESSINGS) ×1 IMPLANT
DISC MOBI-C CERVICAL 15X17 H5 (Miscellaneous) ×2 IMPLANT
DRAPE C-ARM 42X72 X-RAY (DRAPES) ×4 IMPLANT
DRAPE C-ARMOR (DRAPES) ×2 IMPLANT
DRAPE LAPAROTOMY 100X72 PEDS (DRAPES) ×2 IMPLANT
DRAPE MICROSCOPE LEICA (MISCELLANEOUS) IMPLANT
DRAPE POUCH INSTRU U-SHP 10X18 (DRAPES) ×2 IMPLANT
DRILL NEURO 2X3.1 SOFT TOUCH (MISCELLANEOUS) ×2
DURAPREP 6ML APPLICATOR 50/CS (WOUND CARE) ×2 IMPLANT
ELECT REM PT RETURN 9FT ADLT (ELECTROSURGICAL) ×2
ELECTRODE REM PT RTRN 9FT ADLT (ELECTROSURGICAL) ×1 IMPLANT
GAUZE SPONGE 4X4 16PLY XRAY LF (GAUZE/BANDAGES/DRESSINGS) IMPLANT
GLOVE BIOGEL PI IND STRL 8.5 (GLOVE) ×1 IMPLANT
GLOVE BIOGEL PI INDICATOR 8.5 (GLOVE) ×1
GLOVE ECLIPSE 8.5 STRL (GLOVE) ×2 IMPLANT
GOWN STRL REUS W/ TWL LRG LVL3 (GOWN DISPOSABLE) IMPLANT
GOWN STRL REUS W/ TWL XL LVL3 (GOWN DISPOSABLE) ×1 IMPLANT
GOWN STRL REUS W/TWL 2XL LVL3 (GOWN DISPOSABLE) ×2 IMPLANT
GOWN STRL REUS W/TWL LRG LVL3 (GOWN DISPOSABLE)
GOWN STRL REUS W/TWL XL LVL3 (GOWN DISPOSABLE) ×2
HALTER HD/CHIN CERV TRACTION D (MISCELLANEOUS) ×1 IMPLANT
HEMOSTAT POWDER KIT SURGIFOAM (HEMOSTASIS) ×2 IMPLANT
KIT BASIN OR (CUSTOM PROCEDURE TRAY) ×2 IMPLANT
KIT ROOM TURNOVER OR (KITS) ×2 IMPLANT
NDL SPNL 22GX3.5 QUINCKE BK (NEEDLE) ×1 IMPLANT
NEEDLE HYPO 22GX1.5 SAFETY (NEEDLE) ×2 IMPLANT
NEEDLE SPNL 22GX3.5 QUINCKE BK (NEEDLE) ×2 IMPLANT
NS IRRIG 1000ML POUR BTL (IV SOLUTION) ×2 IMPLANT
PACK LAMINECTOMY NEURO (CUSTOM PROCEDURE TRAY) ×2 IMPLANT
PAD ARMBOARD 7.5X6 YLW CONV (MISCELLANEOUS) ×6 IMPLANT
RUBBERBAND STERILE (MISCELLANEOUS) IMPLANT
SPONGE INTESTINAL PEANUT (DISPOSABLE) ×2 IMPLANT
STOCKINETTE 6  STRL (DRAPES) ×2
STOCKINETTE 6 STRL (DRAPES) IMPLANT
SUT VIC AB 3-0 SH 8-18 (SUTURE) ×4 IMPLANT
TOWEL OR 17X24 6PK STRL BLUE (TOWEL DISPOSABLE) ×2 IMPLANT
TOWEL OR 17X26 10 PK STRL BLUE (TOWEL DISPOSABLE) ×2 IMPLANT
WATER STERILE IRR 1000ML POUR (IV SOLUTION) ×2 IMPLANT

## 2016-05-06 NOTE — Transfer of Care (Signed)
Immediate Anesthesia Transfer of Care Note  Patient: John GrebeJames S Lajara  Procedure(s) Performed: Procedure(s) with comments: Cervical five- six Cervical six- seven Cervical artificial disc replacement (N/A) - C5-6 C6-7 Cervical artificial disc replacement  Patient Location: PACU  Anesthesia Type:General  Level of Consciousness: awake, alert , oriented and patient cooperative  Airway & Oxygen Therapy: Patient Spontanous Breathing and Patient connected to nasal cannula oxygen  Post-op Assessment: Report given to RN, Post -op Vital signs reviewed and stable and Patient moving all extremities X 4  Post vital signs: Reviewed and stable  Last Vitals:  Filed Vitals:   05/06/16 1212  BP: 151/95  Pulse: 76  Temp: 36.6 C  Resp: 18    Last Pain: There were no vitals filed for this visit.       Complications: No apparent anesthesia complications

## 2016-05-06 NOTE — H&P (Signed)
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  HISTORY OF PRESENT ILLNESS:                     John Lloyd is a 55 year old, right-handed and left-handed, individual whom I have seen and treated in the past. 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 It took John Lloyd a while to recover, but gradually he did, and he has done reasonably well with his back. In the last year or so, he has noted that he has had increasing symptoms in his upper extremities with pain in his neck and a strange electrical-like sensation when he turns his head in certain fashions that is nearly disabling at times. He also notes that he has had numbness in his hands with a tendency to drop simple and light objections, such as a cup of coffee or a sheaf of papers. Nonetheless, he has been persisting with maintaining a good level of activity, exercising regularly lifting some heavy weights with his upper extremities. The problem is that he has had episodes associated with this electrical-like sensation that includes chest pain. He has had a cardiac workup, and no cardiac issues were ever found. An MRI was ultimately performed of the cervical spine in April, and he brings this study with him. It demonstrates that not unlike his lumbar spine, he has congenital spinal stenosis in the neck. He has some moderate degenerative changes in the discs at C5-6 and at C6-7 that causes severe central canal stenosis and flattening of his cervical spinal cord. He also has biforaminal stenosis at C5-6. At C6-7, he has a similar finding with a more right-sided than left-sided foraminal stenosis. I reviewed these findings, and today in the office I obtained some plain radiographs of the cervical spine with flexion and extension views. These views demonstrate that he has some moderate degrees of cervical spondylosis at C5-6 and again  at C6-7. However, he has well preserved motion of both these joints, in addition to normal motion at the C4-5 joint where he has just the earliest hint of some modest bulging of the disc.  REVIEW OF SYSTEMS:                                    His systems review is notable for ringing in his ears, some sinus problems, high cholesterol, joint pain and swelling, neck pain, arm weakness, all noted on a 14-point review sheet in the office today.   PAST MEDICAL HISTORY:                                  . Current Medical Conditions:  His Past Medical History reveals that his general health has been good. He does have some type 2 diabetes. He is currently taking Metformin 1000 mg twice daily.  . Medications and Allergies:  HE NOTES ALLERGIES TO MORPHINE, WHICH GIVES HIM A BAD HEADACHE; AND SULFA, WHICH GIVES HIM HIVES.  PHYSICAL EXAMINATION:  On his physical exam, I note that his range of motion is quite excellent, turning 70 degrees left and right, flexing and extending normally. Axial compression reproducing both the Spurling test and a Lhermitte's phenomenon. His motor function is good in the deltoids, the biceps, the triceps, the grips, and the intrinsics with normal tone and bulk. Absent reflexes are noted in the biceps, triceps, and the brachioradialis, trace reflexes in the patellae, absent in the Achilles.  IMPRESSION AND PLAN:                                 The patient has evidence of significant cervical spondylosis with cord compression at C5-6 and at C6-7. I have advised that ultimately he needs to have both these areas decompressed. I believe it would be in his best interest to maintain the integrity and mobility of these joints, as I would be concerned about the potential for adjacent level disease occurring early down the road if he had a fusion at these two levels. I discussed with him doing an arthroplasty using a Mobi-C arthroplasty device to preserve the mobility  of the C5-6 and C6-7 joints. Though he does have some modest spondylytic changes, I do not believe it is so severe that it would preclude doing an arthroplasty. I did note the risk is that he ultimately may go on to fuse the C5-6 or the C6-7 joint on his own. If this were to be the case and there was any significant pain involved, we would likely help the process along, but for the time being, it will be good to preserve the integrity of the joint spaces and we will do this by decompressing his spine adequately at C5-6 and C6-7 and doing the arthroplasty at those levels.

## 2016-05-06 NOTE — Progress Notes (Signed)
PHARMACIST - PHYSICIAN ORDER COMMUNICATION  CONCERNING: P&T Medication Policy on Herbal Medications  DESCRIPTION:  This patient's order for:  Glucosamine, Milk Thistle, and 5-HTP caps  have been noted.  This product(s) is classified as an "herbal" or natural product. Due to a lack of definitive safety studies or FDA approval, nonstandard manufacturing practices, plus the potential risk of unknown drug-drug interactions while on inpatient medications, the Pharmacy and Therapeutics Committee does not permit the use of "herbal" or natural products of this type within [facility].   ACTION TAKEN: The pharmacy department is unable to verify this order at this time and your patient has been informed of this safety policy. Please reevaluate patient's clinical condition at discharge and address if the herbal or natural product(s) should be resumed at that time.  Alfredo BachJoseph Arminger, BS, PharmD

## 2016-05-06 NOTE — Anesthesia Procedure Notes (Signed)
Procedure Name: Intubation Date/Time: 05/06/2016 3:15 PM Performed by: Rosiland OzMEYERS, Becka Lagasse Pre-anesthesia Checklist: Patient identified, Emergency Drugs available, Suction available, Patient being monitored and Timeout performed Patient Re-evaluated:Patient Re-evaluated prior to inductionOxygen Delivery Method: Circle system utilized Preoxygenation: Pre-oxygenation with 100% oxygen Intubation Type: IV induction Laryngoscope Size: Glidescope and 4 Grade View: Grade I Tube size: 7.5 mm Number of attempts: 1 Airway Equipment and Method: Stylet Placement Confirmation: ETT inserted through vocal cords under direct vision,  positive ETCO2 and breath sounds checked- equal and bilateral Secured at: 23 cm Tube secured with: Tape Dental Injury: Teeth and Oropharynx as per pre-operative assessment

## 2016-05-06 NOTE — Anesthesia Preprocedure Evaluation (Addendum)
Anesthesia Evaluation  Patient identified by MRN, date of birth, ID band Patient awake    Reviewed: Allergy & Precautions, H&P , NPO status , Patient's Chart, lab work & pertinent test results  History of Anesthesia Complications Negative for: history of anesthetic complications  Airway Mallampati: I  TM Distance: >3 FB Neck ROM: full    Dental  (+) Missing, Dental Advisory Given   Pulmonary Current Smoker,    Pulmonary exam normal breath sounds clear to auscultation       Cardiovascular negative cardio ROS Normal cardiovascular exam Rhythm:regular Rate:Normal     Neuro/Psych Anxiety negative neurological ROS     GI/Hepatic Neg liver ROS, GERD  ,  Endo/Other  diabetesMorbid obesity  Renal/GU negative Renal ROS     Musculoskeletal  (+) Arthritis ,   Abdominal   Peds  Hematology negative hematology ROS (+)   Anesthesia Other Findings Patient has massive anterior beard but reassuring airway with mallampati 1, good neck extension, good TM distance  Reproductive/Obstetrics negative OB ROS                            Anesthesia Physical Anesthesia Plan  ASA: III  Anesthesia Plan: General   Post-op Pain Management:    Induction: Intravenous  Airway Management Planned: Oral ETT  Additional Equipment:   Intra-op Plan:   Post-operative Plan: Extubation in OR  Informed Consent: I have reviewed the patients History and Physical, chart, labs and discussed the procedure including the risks, benefits and alternatives for the proposed anesthesia with the patient or authorized representative who has indicated his/her understanding and acceptance.   Dental Advisory Given  Plan Discussed with: Anesthesiologist, CRNA and Surgeon  Anesthesia Plan Comments:         Anesthesia Quick Evaluation

## 2016-05-06 NOTE — Progress Notes (Signed)
Patient ID: John Lloyd, male   DOB: 1960-12-18, 55 y.o.   MRN: [id number] Vital signs are stable Patient is awakening well No numbness in the arms or shoulders Moderate posterior centralized neck pain Doing well postop

## 2016-05-07 ENCOUNTER — Encounter (HOSPITAL_COMMUNITY): Payer: Self-pay | Admitting: Neurological Surgery

## 2016-05-07 DIAGNOSIS — M4722 Other spondylosis with radiculopathy, cervical region: Secondary | ICD-10-CM | POA: Diagnosis not present

## 2016-05-07 DIAGNOSIS — F17200 Nicotine dependence, unspecified, uncomplicated: Secondary | ICD-10-CM | POA: Diagnosis not present

## 2016-05-07 DIAGNOSIS — M50122 Cervical disc disorder at C5-C6 level with radiculopathy: Secondary | ICD-10-CM | POA: Diagnosis not present

## 2016-05-07 DIAGNOSIS — M4712 Other spondylosis with myelopathy, cervical region: Secondary | ICD-10-CM | POA: Diagnosis not present

## 2016-05-07 DIAGNOSIS — Z981 Arthrodesis status: Secondary | ICD-10-CM | POA: Diagnosis not present

## 2016-05-07 DIAGNOSIS — M50022 Cervical disc disorder at C5-C6 level with myelopathy: Secondary | ICD-10-CM | POA: Diagnosis not present

## 2016-05-07 DIAGNOSIS — E119 Type 2 diabetes mellitus without complications: Secondary | ICD-10-CM | POA: Diagnosis not present

## 2016-05-07 DIAGNOSIS — M50123 Cervical disc disorder at C6-C7 level with radiculopathy: Secondary | ICD-10-CM | POA: Diagnosis not present

## 2016-05-07 DIAGNOSIS — F419 Anxiety disorder, unspecified: Secondary | ICD-10-CM | POA: Diagnosis not present

## 2016-05-07 LAB — GLUCOSE, CAPILLARY: Glucose-Capillary: 165 mg/dL — ABNORMAL HIGH (ref 65–99)

## 2016-05-07 MED ORDER — DIAZEPAM 5 MG PO TABS: 5.0000 mg | ORAL_TABLET | Freq: Four times a day (QID) | ORAL | Status: AC | PRN

## 2016-05-07 MED ORDER — HYDROCODONE-ACETAMINOPHEN 5-325 MG PO TABS: 1.0000 | ORAL_TABLET | ORAL | Status: AC | PRN

## 2016-05-07 NOTE — Discharge Summary (Signed)
Physician Discharge Summary  Patient ID: John Lloyd MRN: [id number] DOB/AGE: August 18, 1961 55 y.o.  Admit date: 05/06/2016 Discharge date: 05/07/2016  Admission Diagnoses:Herniated nucleus pulposus with radiculopathy C5-6 C6-C7  Discharge Diagnoses: Herniated nucleus pulposus with radiculopathy C5-6 C6-C7 Active Problems:   Cervical spondylosis with myelopathy and radiculopathy   Discharged Condition: good  Hospital Course: Patient was admitted to undergo surgical decompression at C5-6 C6-C7 and the underwent arthroplasty at those 2 levels also. He tolerated the surgery well.  Consults: None  Significant Diagnostic Studies: None  Treatments: surgery: Anterior cervical decompression C5-6 C6-C7 arthroplasty with Mobius C device  Discharge Exam: Blood pressure 146/86, pulse 80, temperature 97.8 F (36.6 C), temperature source Oral, resp. rate 20, height 6\' 1"  (1.854 m), weight 138.665 kg (305 lb 11.2 oz), SpO2 99 %. Incision is clean and dry. Motor function is intact in upper extremities. Station and gait are intact.  Disposition:  discharge home  Discharge Instructions    Call MD for:  redness, tenderness, or signs of infection (pain, swelling, redness, odor or green/yellow discharge around incision site)    Complete by:  As directed      Call MD for:  severe uncontrolled pain    Complete by:  As directed      Call MD for:  temperature >100.4    Complete by:  As directed      Diet - low sodium heart healthy    Complete by:  As directed      Discharge instructions    Complete by:  As directed   Okay to shower. Do not apply salves or appointments to incision. No heavy lifting with the upper extremities greater than 15 pounds. May resume driving when not requiring pain medication and patient feels comfortable with doing so.     Increase activity slowly    Complete by:  As directed             Medication List    TAKE these medications        5-HTP PO  Take 1 tablet by  mouth daily.     b complex vitamins capsule  Take 1 capsule by mouth daily. B- 100 Complex     diazepam 5 MG tablet  Commonly known as:  VALIUM  Take 1 tablet (5 mg total) by mouth every 6 (six) hours as needed for muscle spasms.     Fish Oil 1200 MG Caps  Take 1,200 mg by mouth daily.     glucosamine-chondroitin 500-400 MG tablet  Take 1 tablet by mouth 2 (two) times daily. One tablet twice a day     HYDROcodone-acetaminophen 5-325 MG tablet  Commonly known as:  NORCO/VICODIN  Take 1-2 tablets by mouth every 4 (four) hours as needed (mild pain).     metFORMIN 1000 MG tablet  Commonly known as:  GLUCOPHAGE  Take 1,000 mg by mouth 2 (two) times daily with a meal.     milk thistle 175 MG tablet  Take 600 mg by mouth daily.     OVER THE COUNTER MEDICATION  Take 1 Dose by mouth daily. Bitter Melon supplement     sodium chloride 0.65 % Soln nasal spray  Commonly known as:  OCEAN  Place 1 spray into both nostrils as needed for congestion.         SignedStefani Dama: John Lloyd 05/07/2016, 10:25 AM

## 2016-05-07 NOTE — Anesthesia Postprocedure Evaluation (Signed)
Anesthesia Post Note  Patient: John GrebeJames S Lloyd  Procedure(s) Performed: Procedure(s) (LRB): Cervical five- six Cervical six- seven Cervical artificial disc replacement (N/A)  Patient location during evaluation: PACU Anesthesia Type: General Level of consciousness: awake and alert Pain management: pain level controlled Vital Signs Assessment: post-procedure vital signs reviewed and stable Respiratory status: spontaneous breathing, nonlabored ventilation, respiratory function stable and patient connected to nasal cannula oxygen Cardiovascular status: blood pressure returned to baseline and stable Postop Assessment: no signs of nausea or vomiting Anesthetic complications: no     Last Vitals:  Filed Vitals:   05/07/16 0325 05/07/16 0724  BP: 144/78 146/86  Pulse: 76 80  Temp: 36.3 C 36.6 C  Resp: 20 20    Last Pain:  Filed Vitals:   05/07/16 1020  PainSc: 3    Pain Goal: Patients Stated Pain Goal: 3 (05/07/16 0624)               [name], [name]

## 2016-05-07 NOTE — Progress Notes (Signed)
Patient is discharged from room 3C02 at this time. Alert and in stable condition. IV site d/c'd and instructions read to patient with understanding verbalized. Left unit with wife and all belongings at side.

## 2016-05-07 NOTE — Op Note (Signed)
Date of surgery: 05/06/2016 Preoperative diagnosis: Herniated nucleus pulposus C5-6 C6-C7 with cervical radiculopathy Post operative diagnosis: Native nucleus pulposus C5-6 C6-C7 with cervical radiculopathy Procedure: Anterior cervical decompression C5-6 and C6-C7, arthroplasty with mobi C device, 17 x 15 mm x 5 mm tall. Surgeon: Barnett AbuHenry Kalai Baca First assistant: Daryl EasternGary Kram M.D. Anesthesia: Gen. endotracheal Indications: Shary KeyJames Mcbane is a 55 year old individual is had significant neck shoulder and arm pain. He has evidence of marked spondylosis at C5-6 and C6-C7. He's been advised regarding surgical decompression and arthroplasty to maintain the mobility of his neck.  Procedure: The patient was brought to the operating room supine on a stretcher. After the smooth induction of general endotracheal anesthesia, he had his shoulders position with a papoose sling that allowed inferior displacement of the shoulders. His neck was extended on a doughnut or shoe and taped in the supine position. The neck was prepped with alcohol and DuraPrep and draped in a sterile fashion after fluoroscopic guidance could identify the C5-6 and the C6-C7 disc spaces. Then a transverse incision was created in the anterior border of the neck and carried down through the platysma. The plane between the*cleidomastoid and the strap muscles dissected bluntly until the prevertebral space was reached. First identifiable disc space was noted to be that of C5-C6. A discectomy was then performed by removing some ventral osteophytes entering the disc space with a #15 blade and using a series of curettes and rongeurs to remove the entirety of the disc. On the left side in the subligamentous space was noted to be a substantial amount of disc material that was herniated. This was removed and then the posterior longitudinal ligament was opened on the left side was noted be some early calcifications in the ligament itself. These calcifications were  removed and the dissection was carried out into the foramen at C5-C6. Dissection wasn't carried out the foramen on the right side at C5-C6. Once the decompression was completed hemostasis was achieved. The endplates were shaved smooth and is felt that a 17 mm diameter 15 mm deep 5 mm tall arthroplasty device would fit best. This was then placed into the interspace under fluoroscopic guidance. Attention was then turned to C6-C7. Complete discectomy was carried out the same fashion high-speed drill was used to drill off some osteophytes from the inferior marginal body of C6 and on the lateral borders of the uncinate processes at C7. Once this was completed then the ligament was completely decompressed the foramen were decompressed and hemostasis was achieved here again a 17 x 15 mm prosthesis was felt to be best fitted to the interspace this was placed under fluoroscopic guidance also. Once placed alignment was checked in both the coronal and sagittal planes with fluoroscopy. Hemostasis was doubly checked and then the platysma was closed with 3-0 Vicryl in interrupted fashion and 3-0 Vicryl was used in subcutaneous tissues. Patient tolerated procedure was returned to recovery room in stable condition. Blood loss is estimated at 100 mL

## 2016-05-07 NOTE — Evaluation (Signed)
Physical Therapy Evaluation and Discharge Patient Details Name: John GrebeJames S Risk MRN: [id number] DOB: 1960-11-28 Today's Date: 05/07/2016   History of Present Illness  55 yo male s/p C5-6 C6-7 ACDF PMH: DM2  Clinical Impression  Patient evaluated by Physical Therapy with no further acute PT needs identified. All education has been completed and the patient has no further questions. At the time of PT eval pt was able to perform transfers and ambulation with modified independence to independence. See below for any follow-up Physical Therapy or equipment needs. PT is signing off. Thank you for this referral.     Follow Up Recommendations Outpatient PT;Supervision - Intermittent    Equipment Recommendations  None recommended by PT    Recommendations for Other Services       Precautions / Restrictions Precautions Precautions: Cervical Precaution Comments: Reviewed precautions during functional mobility Restrictions Weight Bearing Restrictions: No      Mobility  Bed Mobility               General bed mobility comments: in chair on arrival  Transfers Overall transfer level: Independent Equipment used: None                Ambulation/Gait Ambulation/Gait assistance: Independent Ambulation Distance (Feet): 400 Feet Assistive device: None Gait Pattern/deviations: WFL(Within Functional Limits)   Gait velocity interpretation: at or above normal speed for age/gender    Stairs Stairs: Yes Stairs assistance: Modified independent (Device/Increase time) Stair Management: One rail Right;Alternating pattern;Forwards Number of Stairs: 10    Wheelchair Mobility    Modified Rankin (Stroke Patients Only)       Balance Overall balance assessment: No apparent balance deficits (not formally assessed)                                           Pertinent Vitals/Pain Pain Assessment: No/denies pain    Home Living Family/patient expects to be discharged  to:: Private residence Living Arrangements: Spouse/significant other Available Help at Discharge: Family Type of Home: House Home Access: Stairs to enter Entrance Stairs-Rails: Right Entrance Stairs-Number of Steps: 5   Home Equipment: None      Prior Function Level of Independence: Independent               Hand Dominance   Dominant Hand: Left (can use both)    Extremity/Trunk Assessment   Upper Extremity Assessment: Defer to OT evaluation           Lower Extremity Assessment: Overall WFL for tasks assessed      Cervical / Trunk Assessment: Other exceptions (hx back surg)  Communication   Communication: No difficulties  Cognition Arousal/Alertness: Awake/alert Behavior During Therapy: WFL for tasks assessed/performed Overall Cognitive Status: Within Functional Limits for tasks assessed                      General Comments      Exercises        Assessment/Plan    PT Assessment Patent does not need any further PT services  PT Diagnosis Acute pain   PT Problem List    PT Treatment Interventions     PT Goals (Current goals can be found in the Care Plan section) Acute Rehab PT Goals PT Goal Formulation: All assessment and education complete, DC therapy    Frequency     Barriers to discharge  Co-evaluation               End of Session   Activity Tolerance: Patient tolerated treatment well Patient left: in chair;with call bell/phone within reach;with nursing/sitter in room;with family/visitor present Nurse Communication: Mobility status    Functional Assessment Tool Used: Clinical judgement Functional Limitation: Mobility: Walking and moving around Mobility: Walking and Moving Around Current Status (Z6109): At least 1 percent but less than 20 percent impaired, limited or restricted Mobility: Walking and Moving Around Goal Status [phone]): At least 1 percent but less than 20 percent impaired, limited or  restricted Mobility: Walking and Moving Around Discharge Status [phone]): At least 1 percent but less than 20 percent impaired, limited or restricted    Time: 0908-0920 PT Time Calculation (min) (ACUTE ONLY): 12 min   Charges:   PT Evaluation $PT Eval Moderate Complexity: 1 Procedure     PT G Codes:   PT G-Codes **NOT FOR INPATIENT CLASS** Functional Assessment Tool Used: Clinical judgement Functional Limitation: Mobility: Walking and moving around Mobility: Walking and Moving Around Current Status (B1478): At least 1 percent but less than 20 percent impaired, limited or restricted Mobility: Walking and Moving Around Goal Status [phone]): At least 1 percent but less than 20 percent impaired, limited or restricted Mobility: Walking and Moving Around Discharge Status [phone]): At least 1 percent but less than 20 percent impaired, limited or restricted    Conni Slipper 05/07/2016, 9:47 AM   Conni Slipper, PT, DPT Acute Rehabilitation Services Pager: [phone]

## 2016-05-07 NOTE — Evaluation (Signed)
Occupational Therapy Evaluation Patient Details Name: John GrebeJames S Lloyd MRN: [id number] DOB: 07/30/1961 Today's Date: 05/07/2016    History of Present Illness 55 yo male s/p C5-6 C6-7 ACDF PMH: DM2   Clinical Impression   Patient evaluated by Occupational Therapy with no further acute OT needs identified. All education has been completed and the patient has no further questions. See below for any follow-up Occupational Therapy or equipment needs. OT to sign off. Thank you for referral.   Pt asking questions about MMA fighting and asked to ask Dr John Lloyd. Pt advised not to operate any motor operated items that vibrate ( lawnmower etc) until cleared by MD. Pt reports all L 1st 2nd 3rd numbness/ tingling are back to normal. Pt reports its normal.     Follow Up Recommendations  No OT follow up    Equipment Recommendations  None recommended by OT    Recommendations for Other Services       Precautions / Restrictions Precautions Precautions: Cervical Precaution Comments: provided handout       Mobility Bed Mobility               General bed mobility comments: in chair on arrival  Transfers Overall transfer level: Independent                    Balance Overall balance assessment: Independent                                          ADL Overall ADL's : Independent                                             Vision     Perception     Praxis      Pertinent Vitals/Pain Pain Assessment: No/denies pain     Hand Dominance Left (can use both)   Extremity/Trunk Assessment Upper Extremity Assessment Upper Extremity Assessment: Overall WFL for tasks assessed   Lower Extremity Assessment Lower Extremity Assessment: Defer to PT evaluation   Cervical / Trunk Assessment Cervical / Trunk Assessment: Other exceptions (hx back surg)   Communication Communication Communication: No difficulties   Cognition  Arousal/Alertness: Awake/alert Behavior During Therapy: WFL for tasks assessed/performed Overall Cognitive Status: Within Functional Limits for tasks assessed                     General Comments       Exercises       Shoulder Instructions      Home Living Family/patient expects to be discharged to:: Private residence Living Arrangements: Spouse/significant other Available Help at Discharge: Family Type of Home: House Home Access: Stairs to enter Secretary/administratorntrance Stairs-Number of Steps: 5 Entrance Stairs-Rails: Right       Bathroom Shower/Tub: Producer, television/film/videoWalk-in shower   Bathroom Toilet: Standard     Home Equipment: None          Prior Functioning/Environment Level of Independence: Independent             OT Diagnosis:     OT Problem List:     OT Treatment/Interventions:      OT Goals(Current goals can be found in the care plan section)    OT Frequency:     Barriers to D/C:  Co-evaluation              End of Session Nurse Communication: Mobility status;Precautions  Activity Tolerance: Patient tolerated treatment well Patient left: in chair;with call bell/phone within reach   Time: 0825-0841 OT Time Calculation (min): 16 min Charges:  OT General Charges $OT Visit: 1 Procedure OT Evaluation $OT Eval Low Complexity: 1 Procedure G-Codes: OT G-codes **NOT FOR INPATIENT CLASS** Functional Assessment Tool Used: clinical judgement Functional Limitation: Self care Self Care Current Status (Z6109): 0 percent impaired, limited or restricted Self Care Goal Status (U0454): 0 percent impaired, limited or restricted Self Care Discharge Status (U9811): 0 percent impaired, limited or restricted  John Lloyd 05/07/2016, 8:52 AM   John Lloyd   OTR/L Pager: [phone] Office: [phone] .

## 2016-05-22 DIAGNOSIS — M4712 Other spondylosis with myelopathy, cervical region: Secondary | ICD-10-CM | POA: Diagnosis not present

## 2016-06-05 DIAGNOSIS — E119 Type 2 diabetes mellitus without complications: Secondary | ICD-10-CM | POA: Diagnosis not present

## 2016-06-05 DIAGNOSIS — Z6841 Body Mass Index (BMI) 40.0 and over, adult: Secondary | ICD-10-CM | POA: Diagnosis not present

## 2016-06-05 DIAGNOSIS — Z9889 Other specified postprocedural states: Secondary | ICD-10-CM | POA: Diagnosis not present

## 2016-06-05 DIAGNOSIS — E785 Hyperlipidemia, unspecified: Secondary | ICD-10-CM | POA: Diagnosis not present

## 2016-07-04 DIAGNOSIS — M4712 Other spondylosis with myelopathy, cervical region: Secondary | ICD-10-CM | POA: Diagnosis not present

## 2016-08-09 DIAGNOSIS — E119 Type 2 diabetes mellitus without complications: Secondary | ICD-10-CM | POA: Diagnosis not present

## 2016-09-07 DIAGNOSIS — E119 Type 2 diabetes mellitus without complications: Secondary | ICD-10-CM | POA: Diagnosis not present

## 2016-09-07 DIAGNOSIS — I10 Essential (primary) hypertension: Secondary | ICD-10-CM | POA: Diagnosis not present

## 2016-09-11 DIAGNOSIS — E785 Hyperlipidemia, unspecified: Secondary | ICD-10-CM | POA: Diagnosis not present

## 2016-09-11 DIAGNOSIS — Z6839 Body mass index (BMI) 39.0-39.9, adult: Secondary | ICD-10-CM | POA: Diagnosis not present

## 2016-09-11 DIAGNOSIS — E119 Type 2 diabetes mellitus without complications: Secondary | ICD-10-CM | POA: Diagnosis not present

## 2016-12-25 DIAGNOSIS — Z79899 Other long term (current) drug therapy: Secondary | ICD-10-CM | POA: Diagnosis not present

## 2016-12-25 DIAGNOSIS — Z9114 Patient's other noncompliance with medication regimen: Secondary | ICD-10-CM | POA: Diagnosis not present

## 2016-12-25 DIAGNOSIS — K219 Gastro-esophageal reflux disease without esophagitis: Secondary | ICD-10-CM | POA: Diagnosis not present

## 2016-12-25 DIAGNOSIS — E119 Type 2 diabetes mellitus without complications: Secondary | ICD-10-CM | POA: Diagnosis not present

## 2016-12-25 DIAGNOSIS — M79675 Pain in left toe(s): Secondary | ICD-10-CM | POA: Diagnosis not present

## 2016-12-25 DIAGNOSIS — E669 Obesity, unspecified: Secondary | ICD-10-CM | POA: Diagnosis not present

## 2016-12-25 DIAGNOSIS — Z6839 Body mass index (BMI) 39.0-39.9, adult: Secondary | ICD-10-CM | POA: Diagnosis not present

## 2016-12-25 DIAGNOSIS — I10 Essential (primary) hypertension: Secondary | ICD-10-CM | POA: Diagnosis not present

## 2016-12-25 DIAGNOSIS — E785 Hyperlipidemia, unspecified: Secondary | ICD-10-CM | POA: Diagnosis not present

## 2016-12-25 DIAGNOSIS — M25512 Pain in left shoulder: Secondary | ICD-10-CM | POA: Diagnosis not present

## 2017-03-07 ENCOUNTER — Ambulatory Visit (INDEPENDENT_AMBULATORY_CARE_PROVIDER_SITE_OTHER): Payer: Self-pay

## 2017-03-07 ENCOUNTER — Other Ambulatory Visit: Payer: Self-pay | Admitting: Physician Assistant

## 2017-03-07 ENCOUNTER — Ambulatory Visit (INDEPENDENT_AMBULATORY_CARE_PROVIDER_SITE_OTHER): Payer: Worker's Compensation | Admitting: Physician Assistant

## 2017-03-07 VITALS — BP 156/78 | HR 84 | Temp 98.2°F | Resp 18 | Ht 73.0 in | Wt 301.8 lb

## 2017-03-07 DIAGNOSIS — M79671 Pain in right foot: Secondary | ICD-10-CM

## 2017-03-07 DIAGNOSIS — S9031XA Contusion of right foot, initial encounter: Secondary | ICD-10-CM

## 2017-03-07 MED ORDER — MELOXICAM 15 MG PO TABS: 15.0000 mg | ORAL_TABLET | Freq: Every day | ORAL | 0 refills | Status: AC

## 2017-03-07 NOTE — Progress Notes (Signed)
     John GrebeJames S Higinbotham April 15, 1961 56 y.o.   Chief Complaint  Patient presents with  . Foot Injury    kicked a stump with right foot inside part is sore x3days    Presents for evaluation of work-related complaint.  Date of Injury: 03/04/2017  History of Present Illness:  He was walking and walked into a tree stump at 7am.   Sharp pain at the medial part of the foot where he hit is right foot, was immediate.  He was able to ambulate and complete task--though limping.  When he went home he noticed swelling and bruising at the medial part of his foot at the sole.  Very tender.   Pain is bearable, no numbness or tingling.  Feels better with tight shoes on.  He continues to be able to ambulate   ROS ROS otherwise unremarkable unless listed above.     Current medications and allergies reviewed and updated. Past medical history, family history, social history have been reviewed and updated.   Physical Exam  Constitutional: He is oriented to person, place, and time and well-developed, well-nourished, and in no distress. No distress.  Eyes: Pupils are equal, round, and reactive to light.  Cardiovascular: Normal rate.   Pulmonary/Chest: Effort normal. No respiratory distress.  Musculoskeletal:  Normal rom of the foot in all planes.  Pain more incited with active foot eversion.  Tender along the medial area of the 1st metatarsal.  Slight blushing at the medial plantar area that stops at the 2ndd metatarsal.   Able to ambulate.  Neurological: He is oriented to person, place, and time.  Skin: Skin is warm and dry. He is not diaphoretic.   Dg Foot Complete Right  Result Date: 03/07/2017 CLINICAL DATA:  Right foot pain with ecchymosis and swelling EXAM: RIGHT FOOT COMPLETE - 3+ VIEW COMPARISON:  None. FINDINGS: There is no evidence of fracture or dislocation. Minimal degenerative change at the first MTP joint and first TMT joint. Small posterior calcaneal enthesophyte. No radiopaque foreign  body. IMPRESSION: No acute osseous abnormality. Electronically Signed   By: Jasmine PangKim  Fujinaga M.D.   On: 03/07/2017 14:40     Assessment and Plan: Restrictive letter given (see letter) Advised RICE, and nsaid use.  Precautions given. Return on Tuesday for recheck.   Contusion of right foot, initial encounter - Plan: meloxicam (MOBIC) 15 MG tablet  Right foot pain - Plan: DG Foot Complete Right, meloxicam (MOBIC) 15 MG tablet  Trena PlattStephanie Terrell Ostrand, PA-C [facility] 5/20/20189:43 AM

## 2017-03-07 NOTE — Patient Instructions (Addendum)
I would like you to ice the foot three times per day for 15 minutes.  We will follow  RICE which is listed below.   Keep the wrap on during the day time, and take off at night.  Follow up on Tuesday.    RICE for Routine Care of Injuries Many injuries can be cared for using rest, ice, compression, and elevation (RICE therapy). Using RICE therapy can help to lessen pain and swelling. It can help your body to heal. Rest  Reduce your normal activities and avoid using the injured part of your body. You can go back to your normal activities when you feel okay and your doctor says it is okay. Ice  Do not put ice on your bare skin.  Put ice in a plastic bag.  Place a towel between your skin and the bag.  Leave the ice on for 20 minutes, 2-3 times a day. Do this for as long as told by your doctor. Compression  Compression means putting pressure on the injured area. This can be done with an elastic bandage. If an elastic bandage has been applied:  Remove and reapply the bandage every 3-4 hours or as told by your doctor.  Make sure the bandage is not wrapped too tight. Wrap the bandage more loosely if part of your body beyond the bandage is blue, swollen, cold, painful, or loses feeling (numb).  See your doctor if the bandage seems to make your problems worse. Elevation  Elevation means keeping the injured area raised. Raise the injured area above your heart or the center of your chest if you can. When should I get help? You should get help if:  You keep having pain and swelling.  Your symptoms get worse. Get help right away if: You should get help right away if:  You have sudden bad pain at or below the area of your injury.  You have redness or more swelling around your injury.  You have tingling or numbness at or below the injury that does not go away when you take off the bandage. This information is not intended to replace advice given to you by your health care provider. Make sure  you discuss any questions you have with your health care provider. Document Released: 03/25/2008 Document Revised: 09/03/2016 Document Reviewed: 09/14/2014 Elsevier Interactive Patient Education  2017 [employer]   Foot Contusion A foot contusion is a deep bruise to the foot. Deep bruises can happen when an injury causes bleeding under the skin. The skin over the bruise may be red and then turn blue, purple, or yellow. Minor injuries will give you a deep bruise that is painless, but deep bruises that are worse may stay painful and swollen for a few weeks. In general, the best treatment for this condition includes rest, ice, pressure (compression), and elevation. This is often called RICE therapy. Follow these instructions at home: RICE Therapy   Rest the injured area. Try to avoid standing or walking while your foot hurts.  If directed, put ice on the injured area:  Put ice in a plastic bag.  Place a towel between your skin and the bag.  Leave the ice on for 20 minutes, 2-3 times per day.  If told, put light pressure (compression) on the injured area using an elastic wrap.  Make sure the wrap is not too tight. If your toes turn numb, cold, or blue, take the wrap off and put it back on more loosely.  Remove and  put the wrap back on as told by your doctor.  Raise (elevate) the injured area above the level of your heart while you are sitting or lying down. General instructions   Take over-the-counter and prescription medicines only as told by your doctor.  Use crutches as told by your doctor, if this applies.  Keep all follow-up visits as told by your doctor. This is important. Contact a doctor if:  Your symptoms do not get better after many days of treatment.  You have more redness, swelling, or pain in your foot or toes.  You have trouble moving the injured area.  Medicine does not help your swelling or pain. Get help right away if:  You have very bad pain.  Your foot  or toes are numb.  Your foot or toes turn very light (pale) or cold.  You cannot move your foot or ankle.  Your foot feels warm when you touch it. This information is not intended to replace advice given to you by your health care provider. Make sure you discuss any questions you have with your health care provider. Document Released: 07/16/2008 Document Revised: 03/14/2016 Document Reviewed: 06/13/2015 Elsevier Interactive Patient Education  2017 [employer].    IF you received an x-ray today, you will receive an invoice from [facility]. Please contact [facility] at [phone] with questions or concerns regarding your invoice.   IF you received labwork today, you will receive an invoice from [facility]. Please contact [facility] at [phone] with questions or concerns regarding your invoice.   Our billing staff will not be able to assist you with questions regarding bills from these companies.  You will be contacted with the lab results as soon as they are available. The fastest way to get your results is to activate your My Chart account. Instructions are located on the last page of this paperwork. If you have not heard from [place] regarding the results in 2 weeks, please contact this office.

## 2017-03-11 ENCOUNTER — Encounter: Payer: Self-pay | Admitting: Physician Assistant

## 2017-03-11 ENCOUNTER — Ambulatory Visit (INDEPENDENT_AMBULATORY_CARE_PROVIDER_SITE_OTHER): Payer: Worker's Compensation | Admitting: Physician Assistant

## 2017-03-11 VITALS — BP 146/88 | HR 89 | Temp 98.0°F | Resp 16 | Ht 71.5 in | Wt 297.8 lb

## 2017-03-11 DIAGNOSIS — S9031XA Contusion of right foot, initial encounter: Secondary | ICD-10-CM | POA: Diagnosis not present

## 2017-03-11 NOTE — Patient Instructions (Signed)
     IF you received an x-ray today, you will receive an invoice from [facility]. Please contact  Radiology at [phone] with questions or concerns regarding your invoice.   IF you received labwork today, you will receive an invoice from [facility]. Please contact [facility] at [phone] with questions or concerns regarding your invoice.   Our billing staff will not be able to assist you with questions regarding bills from these companies.  You will be contacted with the lab results as soon as they are available. The fastest way to get your results is to activate your My Chart account. Instructions are located on the last page of this paperwork. If you have not heard from us regarding the results in 2 weeks, please contact this office.     

## 2017-03-11 NOTE — Telephone Encounter (Signed)
03/17/17 last refill

## 2017-03-12 NOTE — Progress Notes (Signed)
[facility] [address], [place] [id number] [phone]  Date:  03/11/2017   Name:  John GrebeJames S Borcherding   [facility]  07-Jul-1961   MRN:  [id number]  PCP:  Wallis MartMcAdoo, Tracy Christina, NP    History of Present Illness:  John Lloyd is a 56 y.o. male patient who presents to PCP with  Chief Complaint  Patient presents with  . Follow-up    on R foot    --here for follow up after 4 days. --he hit his foot on a tree stump.  Xray normal.  Likely contusion --he reports improvement of his symptoms --icing, anti-inflammatory, wrapping, and elevating. --he feels that he can ambulate normally at this time.   No family history on file.  Allergies  Allergen Reactions  . Morphine And Related Other (See Comments)    migraines  . Sulfa Antibiotics Hives and Rash    Medication list has been reviewed and updated.    ROS ROS otherwise unremarkable unless listed above.  Physical Examination: BP (!) 146/88 (BP Location: Right Arm, Patient Position: Sitting, Cuff Size: Large)   Pulse 89   Temp 98 F (36.7 C) (Oral)   Resp 16   Ht 5' 11.5" (1.816 m)   Wt 297 lb 12.8 oz (135.1 kg)   SpO2 94%   BMI 40.96 kg/m  Ideal Body Weight: Weight in (lb) to have BMI = 25: 181.4  Physical Exam  Constitutional: He is oriented to person, place, and time. He appears well-developed.  Eyes: Pupils are equal, round, and reactive to light.  Musculoskeletal: Normal range of motion.  Medial tenderness at the plantar aspect of 1st tarsal area.  Normal strength of the foot.    Neurological: He is alert and oriented to person, place, and time.  Skin: Skin is warm and dry. He is not diaphoretic.     Assessment and Plan: John GrebeJames S Alvelo is a 56 y.o. male who is here today for foot pain This appears resolving.  He will continue regimen.  Restriction release.  Return within the week if complication Contusion of right foot, initial encounter  Trena PlattStephanie Shantrell Placzek, PA-C [facility] 5/25/20186:25 PM

## 2017-04-08 ENCOUNTER — Other Ambulatory Visit: Payer: Self-pay | Admitting: Physician Assistant

## 2017-04-08 DIAGNOSIS — S9031XA Contusion of right foot, initial encounter: Secondary | ICD-10-CM

## 2017-04-08 DIAGNOSIS — M79671 Pain in right foot: Secondary | ICD-10-CM

## 2017-04-09 NOTE — Telephone Encounter (Signed)
Please see if patient is requesting this

## 2017-04-16 NOTE — Telephone Encounter (Signed)
IC patient.  LMOVM to inquire if he requested refill or if it was an automatic refill by pharmacy. Asked pt to return call and advise.

## 2017-04-24 DIAGNOSIS — Z9114 Patient's other noncompliance with medication regimen: Secondary | ICD-10-CM | POA: Diagnosis not present

## 2017-04-24 DIAGNOSIS — Z713 Dietary counseling and surveillance: Secondary | ICD-10-CM | POA: Diagnosis not present

## 2017-04-24 DIAGNOSIS — E119 Type 2 diabetes mellitus without complications: Secondary | ICD-10-CM | POA: Diagnosis not present

## 2017-04-24 DIAGNOSIS — E785 Hyperlipidemia, unspecified: Secondary | ICD-10-CM | POA: Diagnosis not present

## 2017-04-24 DIAGNOSIS — Z6841 Body Mass Index (BMI) 40.0 and over, adult: Secondary | ICD-10-CM | POA: Diagnosis not present

## 2017-05-30 ENCOUNTER — Ambulatory Visit: Payer: Commercial Managed Care - HMO | Admitting: Sports Medicine

## 2017-06-27 ENCOUNTER — Ambulatory Visit (INDEPENDENT_AMBULATORY_CARE_PROVIDER_SITE_OTHER): Payer: Medicare HMO | Admitting: Sports Medicine

## 2017-06-27 ENCOUNTER — Encounter (INDEPENDENT_AMBULATORY_CARE_PROVIDER_SITE_OTHER): Payer: Self-pay

## 2017-06-27 ENCOUNTER — Encounter: Payer: Self-pay | Admitting: Sports Medicine

## 2017-06-27 VITALS — BP 123/84 | HR 79

## 2017-06-27 DIAGNOSIS — M79675 Pain in left toe(s): Secondary | ICD-10-CM | POA: Diagnosis not present

## 2017-06-27 DIAGNOSIS — E119 Type 2 diabetes mellitus without complications: Secondary | ICD-10-CM | POA: Diagnosis not present

## 2017-06-27 DIAGNOSIS — M79674 Pain in right toe(s): Secondary | ICD-10-CM | POA: Diagnosis not present

## 2017-06-27 DIAGNOSIS — B351 Tinea unguium: Secondary | ICD-10-CM

## 2017-06-27 NOTE — Patient Instructions (Addendum)

## 2017-06-27 NOTE — Progress Notes (Signed)
Subjective: John Lloyd is a 56 y.o. male patient with history of diabetes who presents to office today complaining of left big toe pain that is now resolved on its own. However, is concerned also about pain at long nails  while ambulating in shoes; unable to trim. Patient states that the glucose reading this morning was not recorded. Patient is assisted by wife who reports that husband is not compliant with taking his medications and monitoring like he should. Patient states that he has lost significant weight due to diet and exercise and is now down to 288 pounds. Patient denies any new changes in medication or new problems. Patient denies any new cramping, numbness, burning or tingling in the legs.  Patient Active Problem List   Diagnosis Date Noted  . Cervical spondylosis with myelopathy and radiculopathy 05/06/2016   Current Outpatient Prescriptions on File Prior to Visit  Medication Sig Dispense Refill  . 5-Hydroxytryptophan (5-HTP PO) Take 1 tablet by mouth daily.    Marland Kitchen b complex vitamins capsule Take 1 capsule by mouth daily. B- 100 Complex    . glucosamine-chondroitin 500-400 MG tablet Take 1 tablet by mouth 2 (two) times daily. One tablet twice a day    . Omega-3 Fatty Acids (FISH OIL) 1200 MG CAPS Take 1,200 mg by mouth daily.    . sodium chloride (OCEAN) 0.65 % SOLN nasal spray Place 1 spray into both nostrils as needed for congestion.    . diazepam (VALIUM) 5 MG tablet Take 1 tablet (5 mg total) by mouth every 6 (six) hours as needed for muscle spasms. (Patient not taking: Reported on 03/11/2017) 40 tablet 0  . HYDROcodone-acetaminophen (NORCO/VICODIN) 5-325 MG tablet Take 1-2 tablets by mouth every 4 (four) hours as needed (mild pain). (Patient not taking: Reported on 03/11/2017) 60 tablet 0  . meloxicam (MOBIC) 15 MG tablet Take 1 tablet (15 mg total) by mouth daily. (Patient not taking: Reported on 06/27/2017) 30 tablet 0  . metFORMIN (GLUCOPHAGE) 1000 MG tablet Take 1,000 mg by mouth 2  (two) times daily with a meal.    . milk thistle 175 MG tablet Take 600 mg by mouth daily.     Marland Kitchen OVER THE COUNTER MEDICATION Take 1 Dose by mouth daily. Bitter Melon supplement     No current facility-administered medications on file prior to visit.    Allergies  Allergen Reactions  . Morphine And Related Other (See Comments)    migraines  . Sulfa Antibiotics Hives and Rash    No results found for this or any previous visit (from the past 2160 hour(s)).  Objective: General: Patient is awake, alert, and oriented x 3 and in no acute distress.  Integument: Skin is warm, dry and supple bilateral. Nails are tender, long, thickened and  dystrophic with subungual debris, consistent with onychomycosis, 1-5 bilateral. No signs of infection. No open lesions or preulcerative lesions present bilateral. Mild dry skin at heels, left greater than right. Remaining integument unremarkable.  Vasculature:  Dorsalis Pedis pulse 2/4 bilateral. Posterior Tibial pulse  2/4 bilateral.  Capillary fill time <3 sec 1-5 bilateral. Positive hair growth to the level of the digits. Temperature gradient within normal limits. No varicosities present bilateral. No edema present bilateral.   Neurology: The patient has intact sensation measured with a 5.07/10g Semmes Weinstein Monofilament at all pedal sites bilateral . Vibratory sensation diminished bilateral with tuning fork. No Babinski sign present bilateral.   Musculoskeletal: No symptomatic pedal deformities noted bilateral. Muscular strength 5/5 in  all lower extremity muscular groups bilateral without pain on range of motion. No tenderness with calf compression bilateral.  Assessment and Plan: Problem List Items Addressed This Visit    None    Visit Diagnoses    Pain due to onychomycosis of toenails of both feet    -  Primary   Diabetes mellitus without complication (HCC)          -Examined patient. -Discussed and educated patient on diabetic foot care,  especially with  regards to the vascular, neurological and musculoskeletal systems.  -Stressed the importance of good glycemic control and the detriment of not  controlling glucose levels in relation to the foot. -Mechanically debrided all nails 1-5 bilateral using sterile nail nipper and filed with dremel without incident  -Recommend O'Keefe healthy feet for dry callus skin at heels -Answered all patient questions -Patient to return  in 3 months for at risk foot care -Patient advised to call the office if any problems or questions arise in the meantime.  Asencion Islamitorya Kinlie Janice, DPM

## 2017-07-17 IMAGING — RF DG CERVICAL SPINE 2 OR 3 VIEWS
1 series · 3 of 3 positions shown · non-contrast
Comparison: 02/29/2016, 04/04/2016

CLINICAL DATA: C5-6 and C6-7 artificial disc replacement

EXAM:
DG C-ARM 61-120 MIN; CERVICAL SPINE - 2-3 VIEW

[Series 1: run · 3 of 3 slices shown]
[im 1/3]
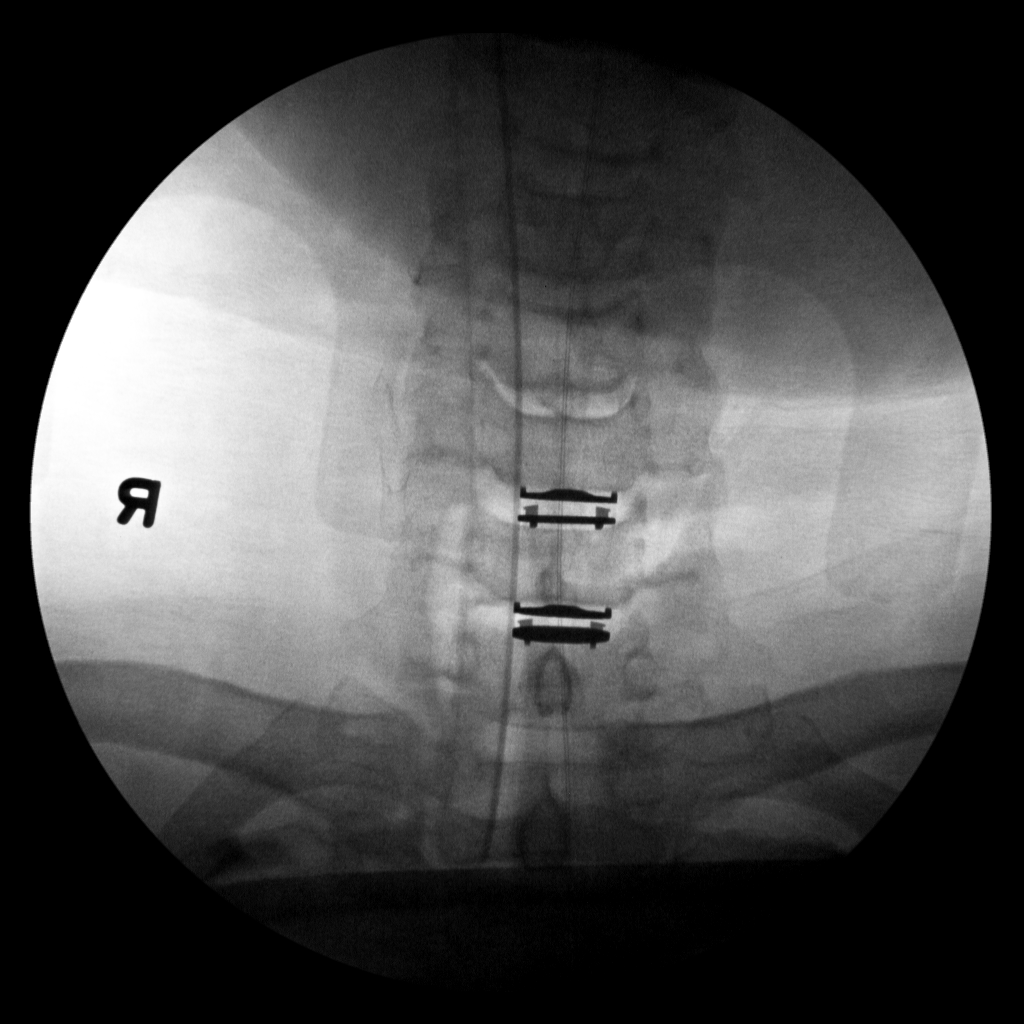
[im 2/3]
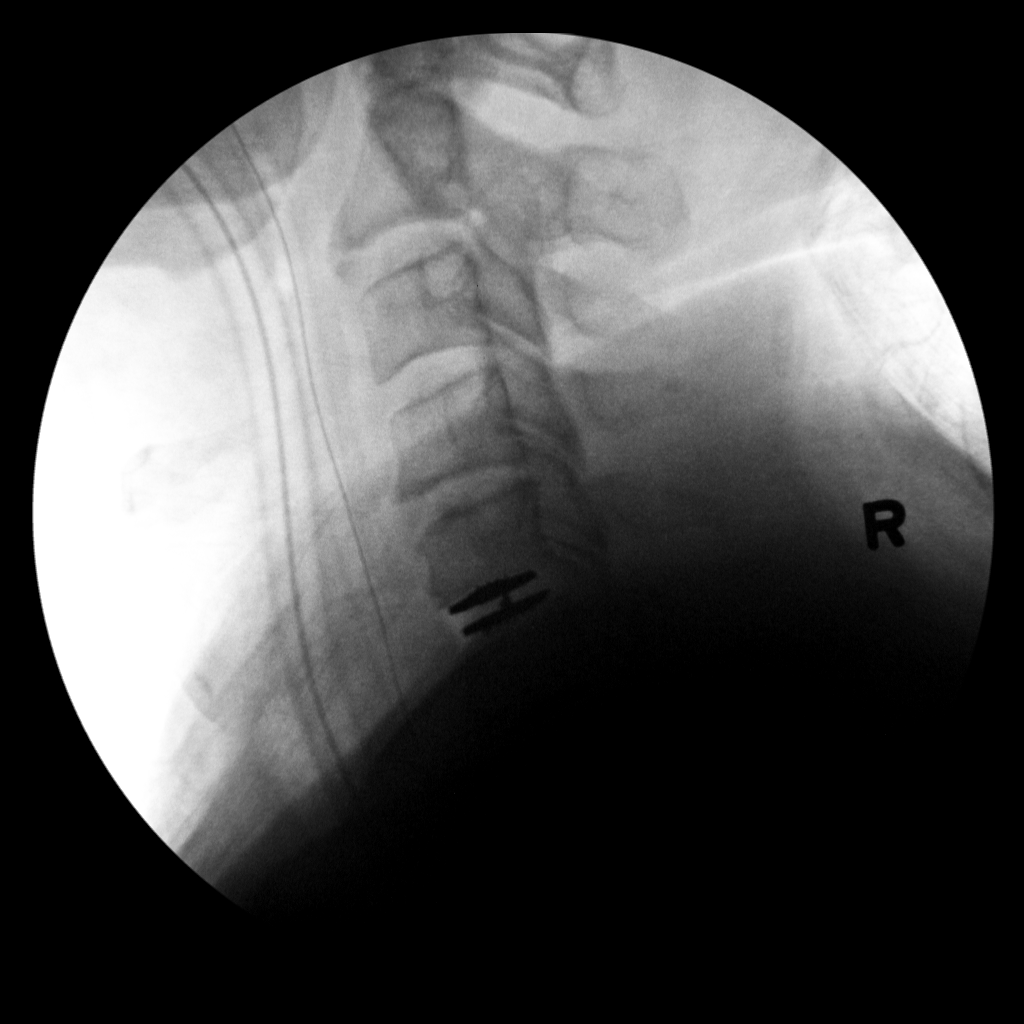
[im 3/3]
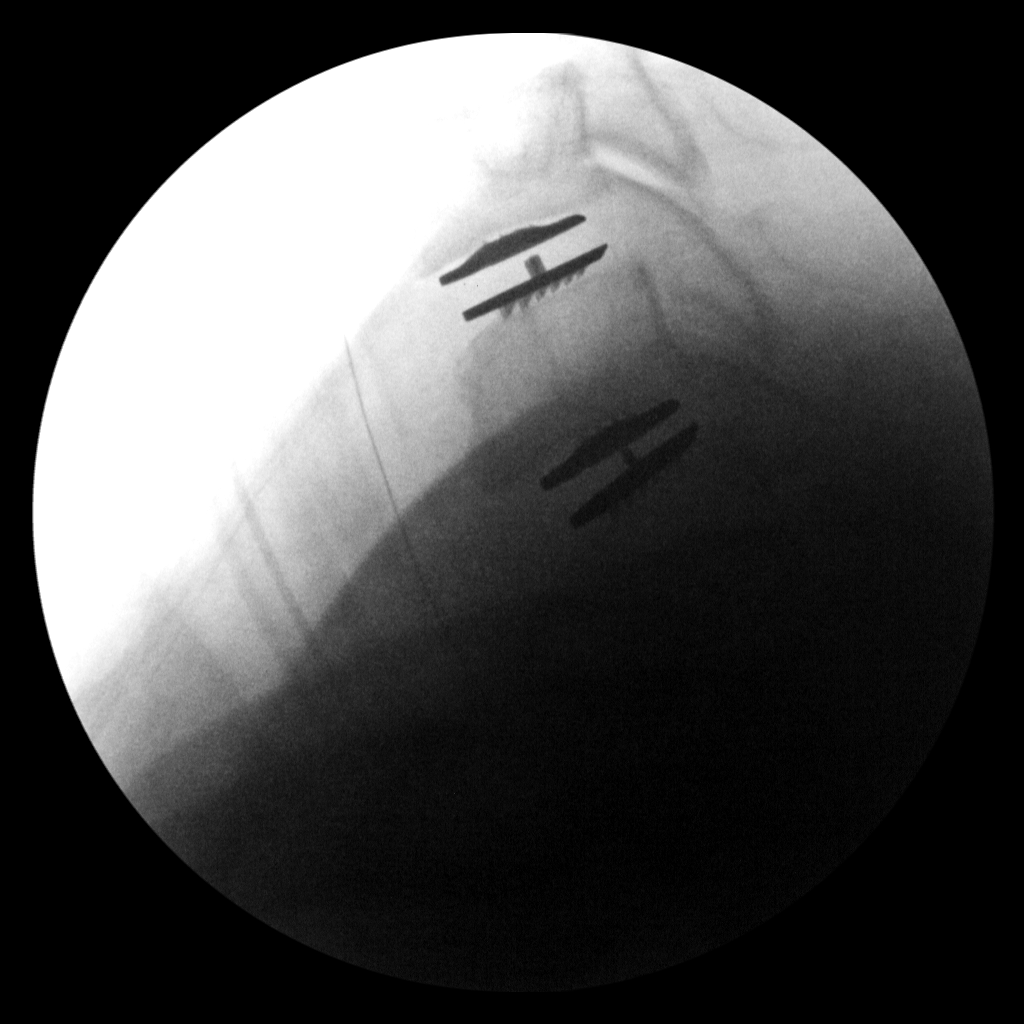

[3 of 3 positions shown; findings below may reference images not displayed]

FINDINGS: Three spot fluoroscopic intraoperative views demonstrate artificial
radiopaque disc replacements at C5-6 and C6-7. Normal alignment. No
acute osseous finding or complicating feature.
IMPRESSION: C5-6 and C6-7 artificial disc replacements.  Stable alignment.

## 2017-07-30 DIAGNOSIS — E785 Hyperlipidemia, unspecified: Secondary | ICD-10-CM | POA: Diagnosis not present

## 2017-07-30 DIAGNOSIS — Z9114 Patient's other noncompliance with medication regimen: Secondary | ICD-10-CM | POA: Diagnosis not present

## 2017-07-30 DIAGNOSIS — E669 Obesity, unspecified: Secondary | ICD-10-CM | POA: Diagnosis not present

## 2017-07-30 DIAGNOSIS — E119 Type 2 diabetes mellitus without complications: Secondary | ICD-10-CM | POA: Diagnosis not present

## 2017-09-26 ENCOUNTER — Ambulatory Visit: Payer: Medicare HMO | Admitting: Sports Medicine

## 2017-11-06 ENCOUNTER — Ambulatory Visit: Payer: Medicare HMO | Admitting: Sports Medicine

## 2017-11-06 ENCOUNTER — Encounter: Payer: Self-pay | Admitting: Sports Medicine

## 2017-11-06 DIAGNOSIS — B351 Tinea unguium: Secondary | ICD-10-CM

## 2017-11-06 DIAGNOSIS — E119 Type 2 diabetes mellitus without complications: Secondary | ICD-10-CM

## 2017-11-06 DIAGNOSIS — M79674 Pain in right toe(s): Secondary | ICD-10-CM

## 2017-11-06 DIAGNOSIS — L84 Corns and callosities: Secondary | ICD-10-CM

## 2017-11-06 DIAGNOSIS — M79675 Pain in left toe(s): Secondary | ICD-10-CM

## 2017-11-06 NOTE — Progress Notes (Signed)
Subjective: John Lloyd is a 57 y.o. male patient with history of diabetes who presents to office today complaining of of long painful nails difficult to do trim himself.  Patient denies any new changes in medication or new problems. Patient denies any new cramping, numbness, burning or tingling in the legs.  Diet controlled diabetic last blood sugar unknown and has not had a recent visit to his primary care doctor  Patient Active Problem List   Diagnosis Date Noted  . Cervical spondylosis with myelopathy and radiculopathy 05/06/2016   Current Outpatient Medications on File Prior to Visit  Medication Sig Dispense Refill  . 5-Hydroxytryptophan (5-HTP PO) Take 1 tablet by mouth daily.    Marland Kitchen. b complex vitamins capsule Take 1 capsule by mouth daily. B- 100 Complex    . diazepam (VALIUM) 5 MG tablet Take 1 tablet (5 mg total) by mouth every 6 (six) hours as needed for muscle spasms. (Patient not taking: Reported on 03/11/2017) 40 tablet 0  . glucosamine-chondroitin 500-400 MG tablet Take 1 tablet by mouth 2 (two) times daily. One tablet twice a day    . HYDROcodone-acetaminophen (NORCO/VICODIN) 5-325 MG tablet Take 1-2 tablets by mouth every 4 (four) hours as needed (mild pain). (Patient not taking: Reported on 03/11/2017) 60 tablet 0  . LECITHIN CONCENTRATE PO Take by mouth.    . meloxicam (MOBIC) 15 MG tablet Take 1 tablet (15 mg total) by mouth daily. (Patient not taking: Reported on 06/27/2017) 30 tablet 0  . metFORMIN (GLUCOPHAGE) 1000 MG tablet Take 1,000 mg by mouth 2 (two) times daily with a meal.    . milk thistle 175 MG tablet Take 600 mg by mouth daily.     . Multiple Vitamins-Minerals (MULTIVITAMIN ADULTS 50+ PO) Take by mouth.    . Omega-3 Fatty Acids (FISH OIL) 1200 MG CAPS Take 1,200 mg by mouth daily.    Marland Kitchen. OVER THE COUNTER MEDICATION Take 1 Dose by mouth daily. Bitter Melon supplement    . sodium chloride (OCEAN) 0.65 % SOLN nasal spray Place 1 spray into both nostrils as needed for  congestion.     No current facility-administered medications on file prior to visit.    Allergies  Allergen Reactions  . Morphine And Related Other (See Comments)    migraines  . Sulfa Antibiotics Hives and Rash    No results found for this or any previous visit (from the past 2160 hour(s)).  Objective: General: Patient is awake, alert, and oriented x 3 and in no acute distress.  Integument: Skin is warm, dry and supple bilateral. Nails are tender, long, thickened and  dystrophic with subungual debris, consistent with onychomycosis, 1-5 bilateral. No signs of infection. No open lesions or preulcerative lesions present bilateral. Mild dry skin at heels, left greater than right. Remaining integument unremarkable.  Vasculature:  Dorsalis Pedis pulse 2/4 bilateral. Posterior Tibial pulse  2/4 bilateral.  Capillary fill time <3 sec 1-5 bilateral. Positive hair growth to the level of the digits. Temperature gradient within normal limits. No varicosities present bilateral. No edema present bilateral.   Neurology: The patient has intact sensation measured with a 5.07/10g Semmes Weinstein Monofilament at all pedal sites bilateral . Vibratory sensation diminished bilateral with tuning fork. No Babinski sign present bilateral.   Musculoskeletal: No symptomatic pedal deformities noted bilateral. Muscular strength 5/5 in all lower extremity muscular groups bilateral without pain on range of motion. No tenderness with calf compression bilateral.  Assessment and Plan: Problem List Items Addressed This  Visit    None    Visit Diagnoses    Pain due to onychomycosis of toenails of both feet    -  Primary   Diabetes mellitus without complication (HCC)       Callus of heel          -Examined patient. -Discussed and educated patient on diabetic foot care, especially with  regards to the vascular, neurological and musculoskeletal systems.  -Stressed the importance of good glycemic control and the  detriment of not  controlling glucose levels in relation to the foot. -Mechanically debrided all nails 1-5 bilateral using sterile nail nipper and filed with dremel without incident  -Recommend O'Keefe healthy feet for dry callus skin at heels -Answered all patient questions -Patient to return  in 3 months for at risk foot care -Patient advised to call the office if any problems or questions arise in the meantime.  Asencion Islam, DPM

## 2017-11-15 DIAGNOSIS — J019 Acute sinusitis, unspecified: Secondary | ICD-10-CM | POA: Diagnosis not present

## 2017-11-15 DIAGNOSIS — J209 Acute bronchitis, unspecified: Secondary | ICD-10-CM | POA: Diagnosis not present

## 2017-12-06 DIAGNOSIS — H524 Presbyopia: Secondary | ICD-10-CM | POA: Diagnosis not present

## 2017-12-06 DIAGNOSIS — H52209 Unspecified astigmatism, unspecified eye: Secondary | ICD-10-CM | POA: Diagnosis not present

## 2017-12-06 DIAGNOSIS — H5203 Hypermetropia, bilateral: Secondary | ICD-10-CM | POA: Diagnosis not present

## 2017-12-06 DIAGNOSIS — E119 Type 2 diabetes mellitus without complications: Secondary | ICD-10-CM | POA: Diagnosis not present

## 2018-01-19 ENCOUNTER — Encounter: Payer: Self-pay | Admitting: Physician Assistant

## 2018-05-18 IMAGING — DX DG FOOT COMPLETE 3+V*R*
3 series · 3 of 3 positions shown · non-contrast
Comparison: None.

CLINICAL DATA: Right foot pain with ecchymosis and swelling

EXAM:
RIGHT FOOT COMPLETE - 3+ VIEW

[foot ap]
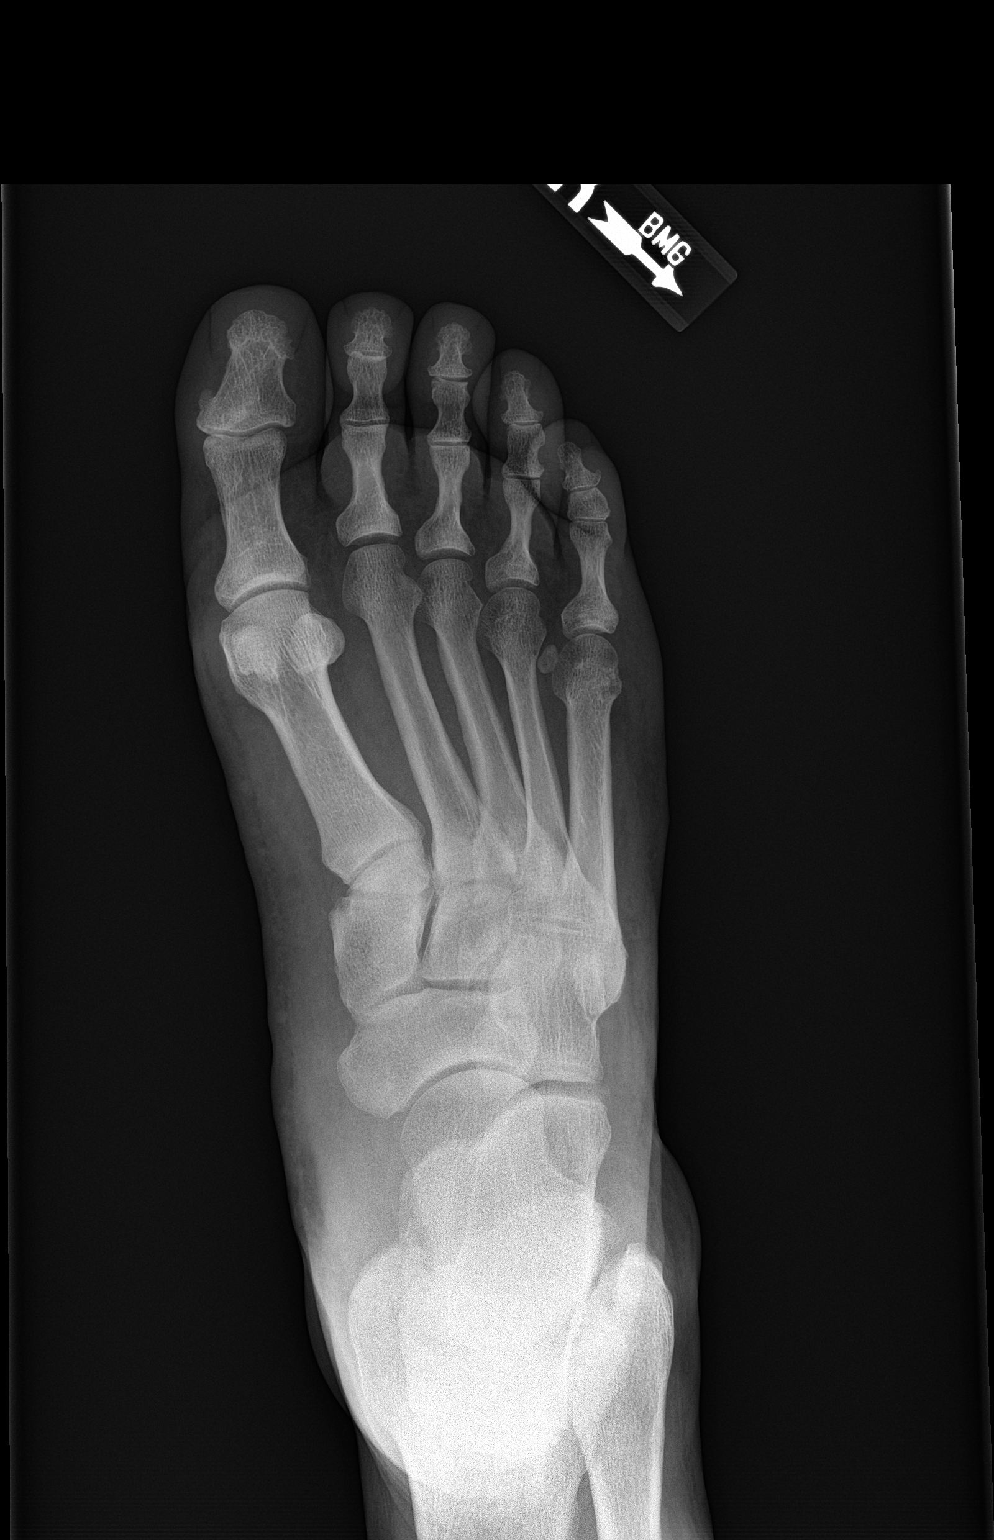

[foot obl]
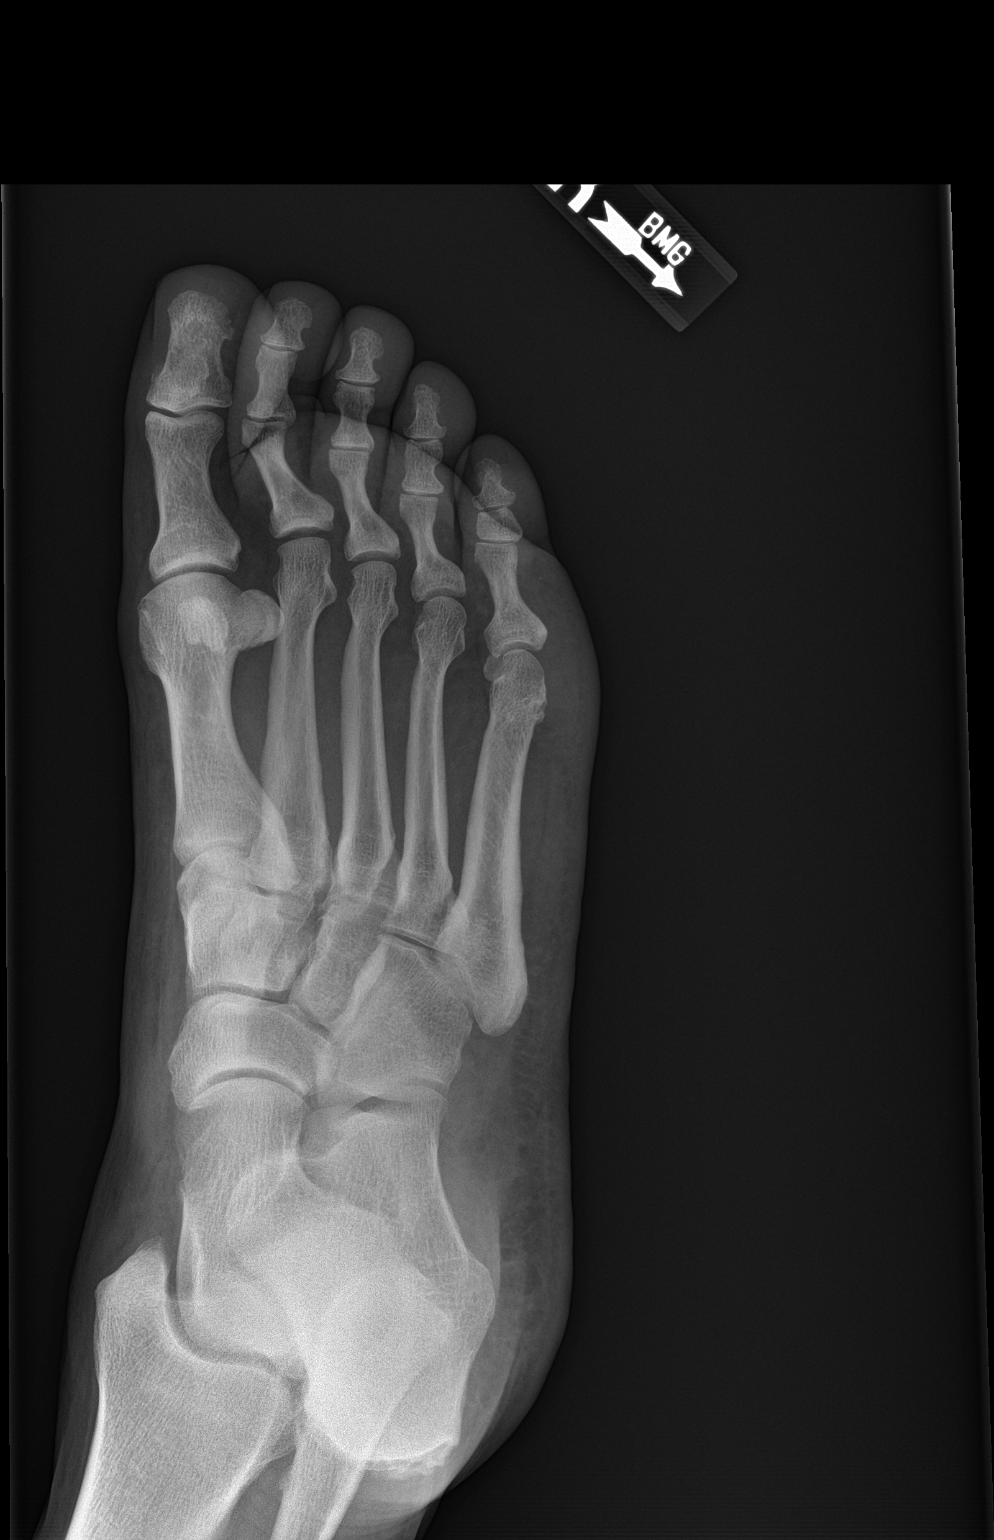

[foot lat]
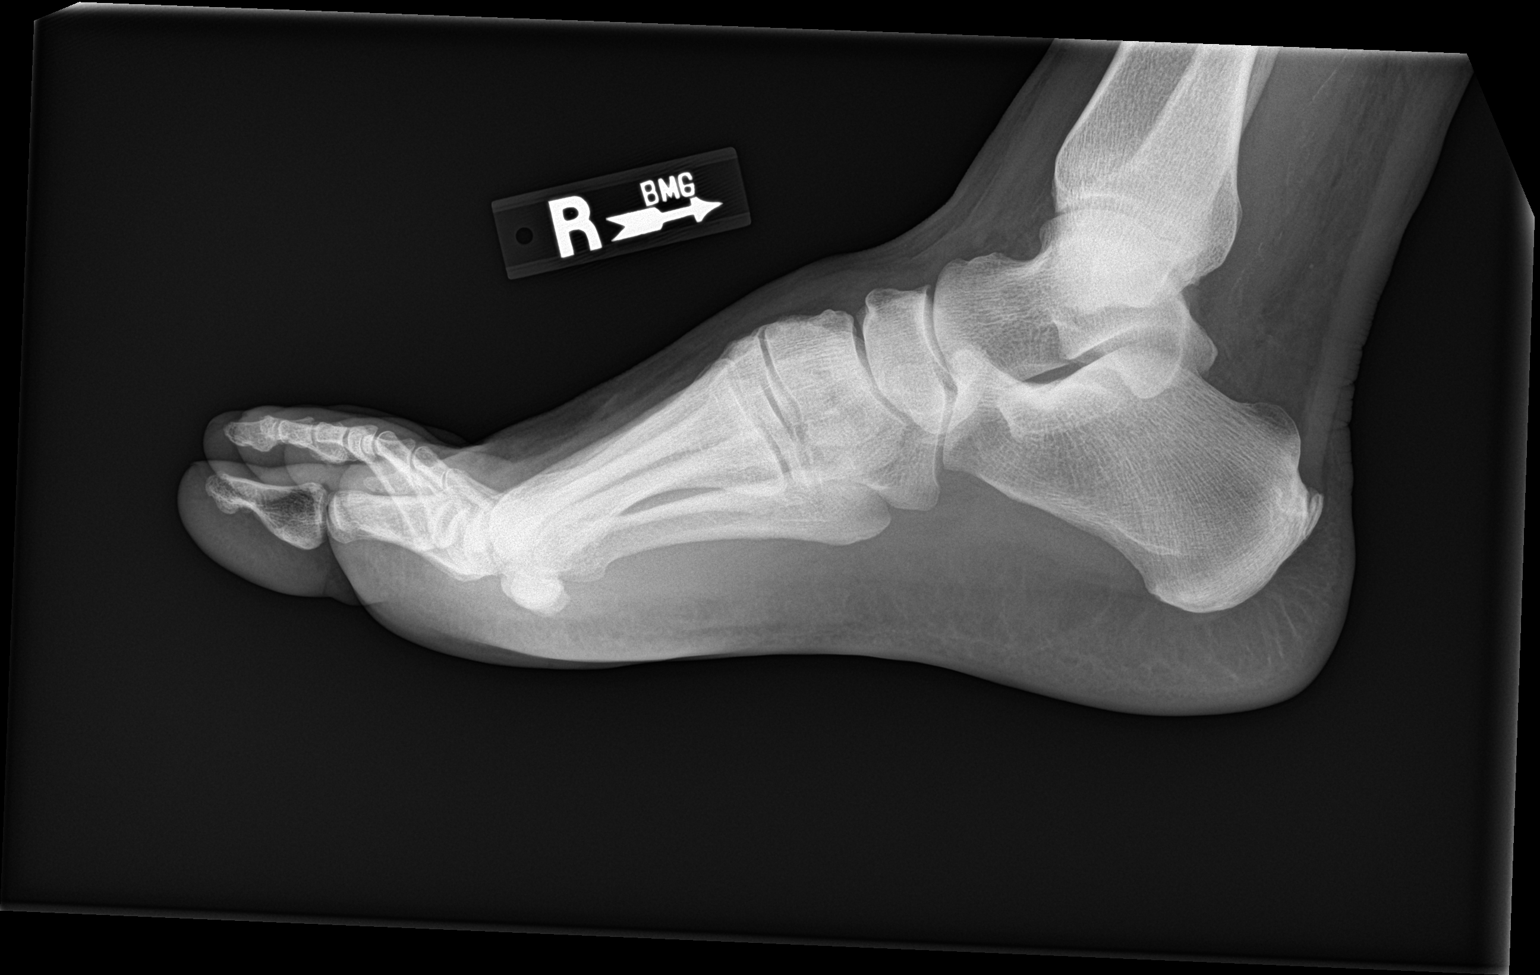

[3 of 3 positions shown; findings below may reference images not displayed]

FINDINGS: There is no evidence of fracture or dislocation. Minimal
degenerative change at the first MTP joint and first TMT joint.
Small posterior calcaneal enthesophyte. No radiopaque foreign body.
IMPRESSION: No acute osseous abnormality.

## 2018-05-27 ENCOUNTER — Ambulatory Visit: Payer: Medicare HMO | Admitting: Sports Medicine

## 2018-07-14 DIAGNOSIS — E119 Type 2 diabetes mellitus without complications: Secondary | ICD-10-CM | POA: Diagnosis not present

## 2018-07-14 DIAGNOSIS — Z6838 Body mass index (BMI) 38.0-38.9, adult: Secondary | ICD-10-CM | POA: Diagnosis not present

## 2018-07-14 DIAGNOSIS — R1011 Right upper quadrant pain: Secondary | ICD-10-CM | POA: Diagnosis not present

## 2018-07-14 DIAGNOSIS — R109 Unspecified abdominal pain: Secondary | ICD-10-CM | POA: Diagnosis not present

## 2018-08-05 DIAGNOSIS — Z0001 Encounter for general adult medical examination with abnormal findings: Secondary | ICD-10-CM | POA: Diagnosis not present

## 2018-08-05 DIAGNOSIS — R809 Proteinuria, unspecified: Secondary | ICD-10-CM | POA: Diagnosis not present

## 2018-08-05 DIAGNOSIS — E118 Type 2 diabetes mellitus with unspecified complications: Secondary | ICD-10-CM | POA: Diagnosis not present

## 2018-08-05 DIAGNOSIS — Z6838 Body mass index (BMI) 38.0-38.9, adult: Secondary | ICD-10-CM | POA: Diagnosis not present

## 2018-08-08 DIAGNOSIS — Z0001 Encounter for general adult medical examination with abnormal findings: Secondary | ICD-10-CM | POA: Diagnosis not present

## 2018-08-08 DIAGNOSIS — E785 Hyperlipidemia, unspecified: Secondary | ICD-10-CM | POA: Diagnosis not present

## 2018-08-08 DIAGNOSIS — E119 Type 2 diabetes mellitus without complications: Secondary | ICD-10-CM | POA: Diagnosis not present

## 2018-08-10 DIAGNOSIS — Z1211 Encounter for screening for malignant neoplasm of colon: Secondary | ICD-10-CM | POA: Diagnosis not present

## 2018-08-10 DIAGNOSIS — Z1212 Encounter for screening for malignant neoplasm of rectum: Secondary | ICD-10-CM | POA: Diagnosis not present

## 2018-10-07 DIAGNOSIS — E118 Type 2 diabetes mellitus with unspecified complications: Secondary | ICD-10-CM | POA: Diagnosis not present

## 2018-10-07 DIAGNOSIS — Z6837 Body mass index (BMI) 37.0-37.9, adult: Secondary | ICD-10-CM | POA: Diagnosis not present

## 2018-12-16 DIAGNOSIS — I10 Essential (primary) hypertension: Secondary | ICD-10-CM | POA: Diagnosis not present

## 2018-12-16 DIAGNOSIS — E118 Type 2 diabetes mellitus with unspecified complications: Secondary | ICD-10-CM | POA: Diagnosis not present

## 2018-12-16 DIAGNOSIS — E119 Type 2 diabetes mellitus without complications: Secondary | ICD-10-CM | POA: Diagnosis not present

## 2018-12-16 DIAGNOSIS — E785 Hyperlipidemia, unspecified: Secondary | ICD-10-CM | POA: Diagnosis not present

## 2018-12-16 DIAGNOSIS — R809 Proteinuria, unspecified: Secondary | ICD-10-CM | POA: Diagnosis not present

## 2018-12-16 DIAGNOSIS — E669 Obesity, unspecified: Secondary | ICD-10-CM | POA: Diagnosis not present

## 2019-03-11 DIAGNOSIS — H524 Presbyopia: Secondary | ICD-10-CM | POA: Diagnosis not present

## 2019-03-11 DIAGNOSIS — H5203 Hypermetropia, bilateral: Secondary | ICD-10-CM | POA: Diagnosis not present

## 2019-03-11 DIAGNOSIS — H5200 Hypermetropia, unspecified eye: Secondary | ICD-10-CM | POA: Diagnosis not present

## 2019-03-11 DIAGNOSIS — H52209 Unspecified astigmatism, unspecified eye: Secondary | ICD-10-CM | POA: Diagnosis not present

## 2019-03-23 DIAGNOSIS — E118 Type 2 diabetes mellitus with unspecified complications: Secondary | ICD-10-CM | POA: Diagnosis not present

## 2019-03-23 DIAGNOSIS — E119 Type 2 diabetes mellitus without complications: Secondary | ICD-10-CM | POA: Diagnosis not present

## 2019-03-23 DIAGNOSIS — E785 Hyperlipidemia, unspecified: Secondary | ICD-10-CM | POA: Diagnosis not present

## 2019-03-23 DIAGNOSIS — E669 Obesity, unspecified: Secondary | ICD-10-CM | POA: Diagnosis not present

## 2019-03-23 DIAGNOSIS — Z6837 Body mass index (BMI) 37.0-37.9, adult: Secondary | ICD-10-CM | POA: Diagnosis not present

## 2020-06-14 ENCOUNTER — Emergency Department (HOSPITAL_COMMUNITY): Payer: No Typology Code available for payment source

## 2020-06-14 ENCOUNTER — Encounter (HOSPITAL_COMMUNITY): Payer: Self-pay

## 2020-06-14 ENCOUNTER — Other Ambulatory Visit: Payer: Self-pay

## 2020-06-14 ENCOUNTER — Emergency Department (HOSPITAL_COMMUNITY)
Admission: EM | Admit: 2020-06-14 | Discharge: 2020-06-15 | Disposition: A | Discharge status: Home / Self Care | Payer: No Typology Code available for payment source | Attending: Emergency Medicine | Admitting: Emergency Medicine

## 2020-06-14 DIAGNOSIS — Z5321 Procedure and treatment not carried out due to patient leaving prior to being seen by health care provider: Secondary | ICD-10-CM | POA: Insufficient documentation

## 2020-06-14 DIAGNOSIS — R5383 Other fatigue: Secondary | ICD-10-CM | POA: Insufficient documentation

## 2020-06-14 DIAGNOSIS — R0602 Shortness of breath: Secondary | ICD-10-CM | POA: Diagnosis present

## 2020-06-14 DIAGNOSIS — U071 COVID-19: Secondary | ICD-10-CM | POA: Diagnosis not present

## 2020-06-14 LAB — TROPONIN I (HIGH SENSITIVITY)
Troponin I (High Sensitivity): 5 ng/L (ref <18)
Troponin I (High Sensitivity): 6 ng/L (ref <18)

## 2020-06-14 LAB — BASIC METABOLIC PANEL
Anion gap: 14 (ref 5–15)
BUN: 13 mg/dL (ref 6–20)
CO2: 23 mmol/L (ref 22–32)
Calcium: 9 mg/dL (ref 8.9–10.3)
Chloride: 99 mmol/L (ref 98–111)
Creatinine, Ser: 0.97 mg/dL (ref 0.61–1.24)
GFR calc Af Amer: >60 mL/min (ref >60)
GFR calc non Af Amer: >60 mL/min (ref >60)
Glucose, Bld: 206 mg/dL — ABNORMAL HIGH (ref 70–99)
Potassium: 3.7 mmol/L (ref 3.5–5.1)
Sodium: 136 mmol/L (ref 135–145)

## 2020-06-14 LAB — CBC
HCT: 50.9 % (ref 39.0–52.0)
Hemoglobin: 16.9 g/dL (ref 13.0–17.0)
MCH: 28.4 pg (ref 26.0–34.0)
MCHC: 33.2 g/dL (ref 30.0–36.0)
MCV: 85.5 fL (ref 80.0–100.0)
Platelets: 121 10*3/uL — ABNORMAL LOW (ref 150–400)
RBC: 5.95 MIL/uL — ABNORMAL HIGH (ref 4.22–5.81)
RDW: 13 % (ref 11.5–15.5)
WBC: 5.5 10*3/uL (ref 4.0–10.5)
nRBC: 0 % (ref 0.0–0.2)

## 2020-06-14 LAB — CBG MONITORING, ED: Glucose-Capillary: 211 mg/dL — ABNORMAL HIGH (ref 70–99)

## 2020-06-14 NOTE — ED Notes (Signed)
Rounded on patient. No distress noted at this time. Pt mentating well, speaking in full sentences, asking for vitals to be rechecked as well as his blood sugar. I have delegated these tasks to the tech. Will continue to monitor.

## 2020-06-14 NOTE — ED Triage Notes (Signed)
Pt tested positive for COVID last week, reports worsening SOB and fatigue, no chest pain. resp e.u

## 2020-06-15 NOTE — ED Notes (Signed)
NA X2 

## 2020-06-15 NOTE — ED Notes (Signed)
Called pt x3 for vitals, no response. 

## 2020-06-15 NOTE — ED Notes (Signed)
Called pt x 3 no answer 

## 2020-07-21 DEATH — deceased

## 2021-08-25 IMAGING — DX DG CHEST 1V PORT
1 series · 1 of 1 positions shown · non-contrast
Comparison: None.

CLINICAL DATA: Shortness of breath.  COVID positive.

EXAM:
PORTABLE CHEST 1 VIEW

[chest ap]
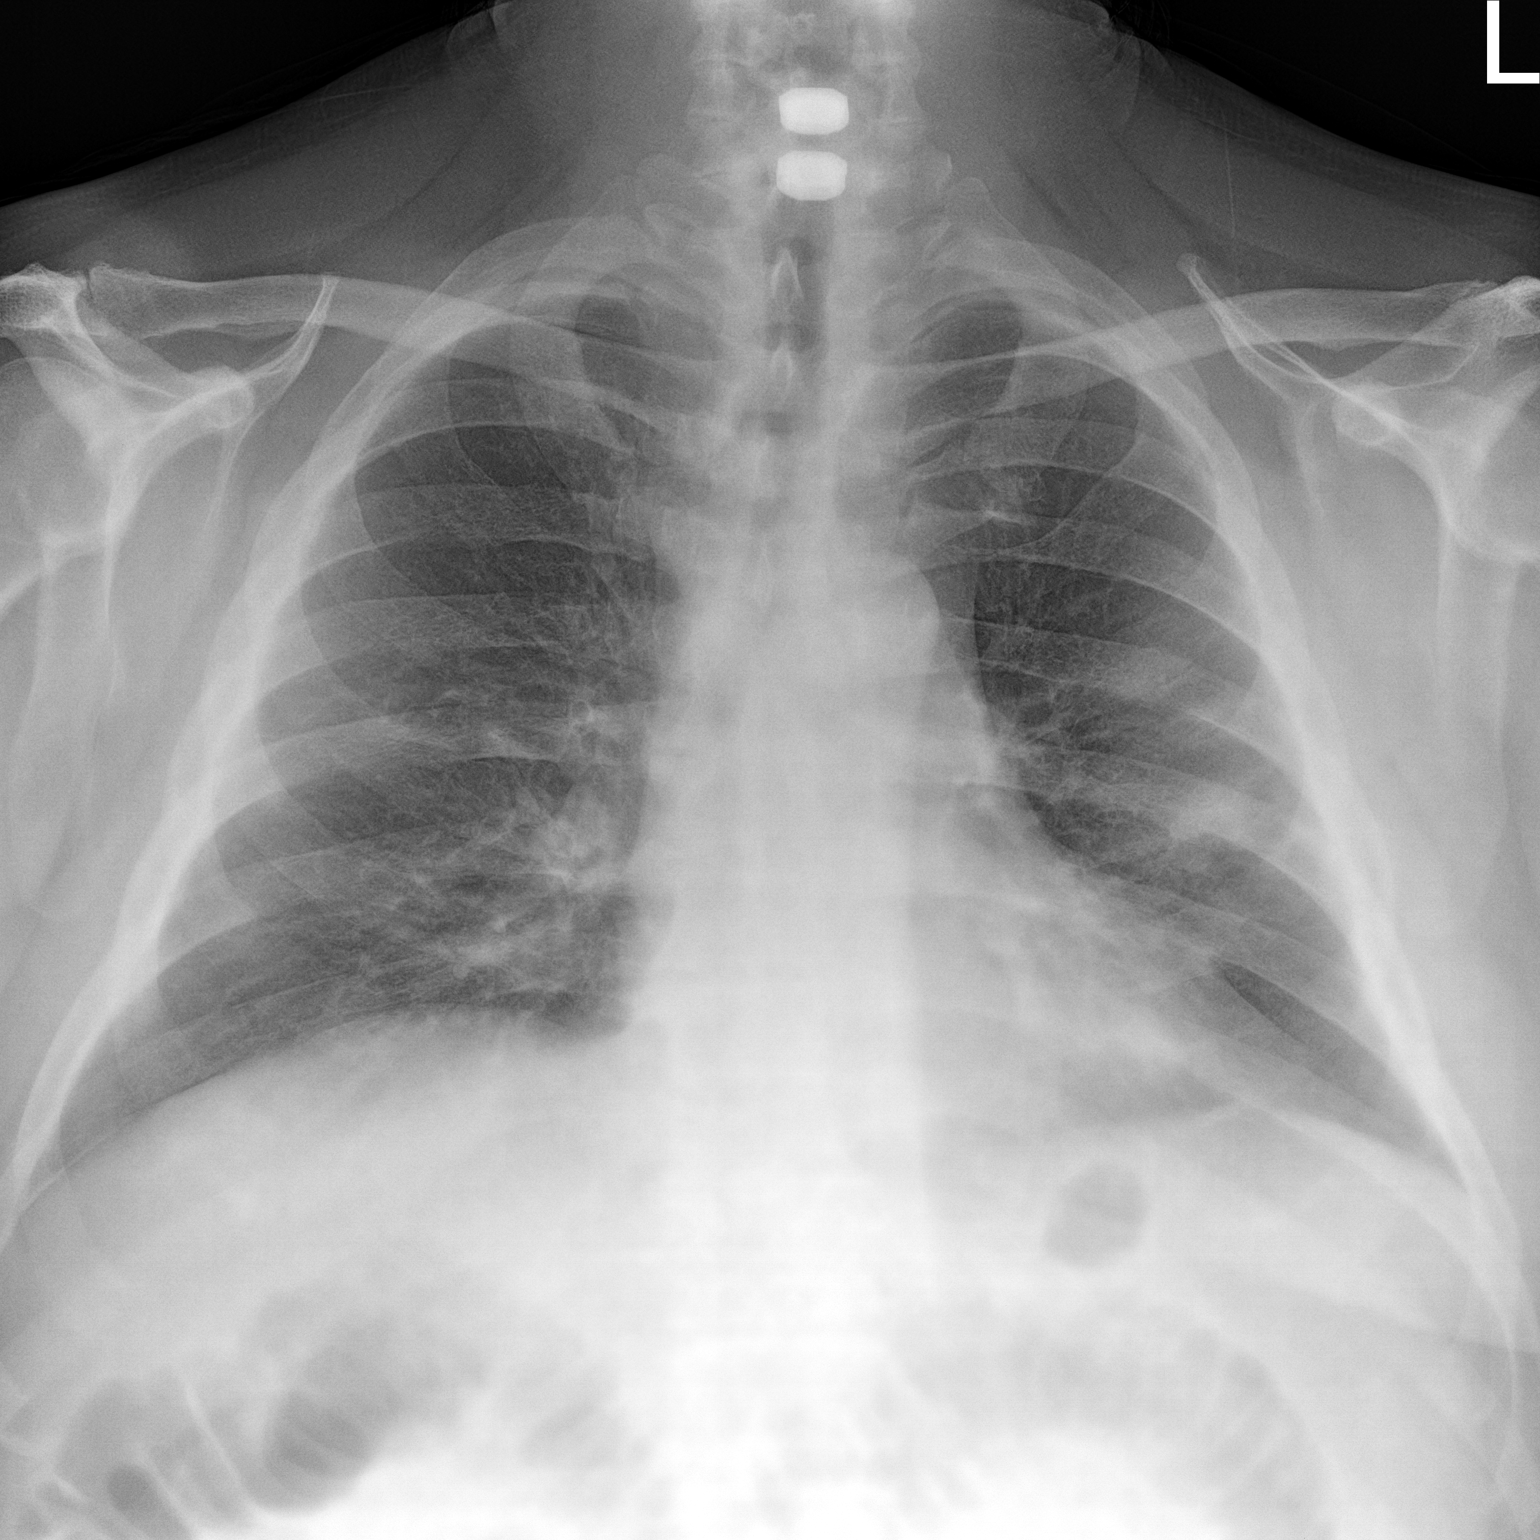

[1 of 1 positions shown; findings below may reference images not displayed]

FINDINGS: Heart is normal in size. Patchy heterogeneous bilateral airspace
opacities in a mid-lower lung zone predominant distribution. Mild
background bronchial/interstitial thickening. No large pleural
effusion. No pneumothorax. Surgical hardware in the lower cervical
spine is partially included. No acute osseous abnormalities are
seen.
IMPRESSION: Patchy heterogeneous bilateral airspace opacities in a mid-lower
lung zone predominant distribution, pattern consistent with 4WCTO-5Y
pneumonia.
